# Patient Record
Sex: Male | Born: 1965 | Race: White | Hispanic: No | Marital: Married | Smoking: Never smoker
Health system: Southern US, Community
[De-identification: ages and names within clinical notes are randomized; demographics above are authoritative.]

## PROBLEM LIST (undated history)

## (undated) HISTORY — PX: MOHS SURGERY: SUR867

## (undated) HISTORY — PX: OTHER SURGICAL HISTORY: SHX169

---

## 1998-08-06 ENCOUNTER — Ambulatory Visit (HOSPITAL_COMMUNITY): Admission: RE | Admit: 1998-08-06 | Discharge: 1998-08-06 | Payer: Self-pay | Admitting: Psychiatry

## 1998-08-07 ENCOUNTER — Other Ambulatory Visit (HOSPITAL_COMMUNITY): Admission: RE | Admit: 1998-08-07 | Discharge: 1998-11-05 | Payer: Self-pay | Admitting: Psychiatry

## 2005-05-13 ENCOUNTER — Encounter: Admission: RE | Admit: 2005-05-13 | Discharge: 2005-05-13 | Payer: Self-pay | Admitting: Family Medicine

## 2006-08-26 ENCOUNTER — Ambulatory Visit: Payer: Self-pay | Admitting: Family Medicine

## 2007-01-14 ENCOUNTER — Ambulatory Visit: Payer: Self-pay | Admitting: Family Medicine

## 2008-03-22 ENCOUNTER — Ambulatory Visit: Payer: Self-pay | Admitting: Family Medicine

## 2009-06-27 ENCOUNTER — Ambulatory Visit: Payer: Self-pay | Admitting: Family Medicine

## 2009-11-07 ENCOUNTER — Ambulatory Visit: Payer: Self-pay | Admitting: Family Medicine

## 2009-12-03 ENCOUNTER — Ambulatory Visit: Payer: Self-pay | Admitting: Family Medicine

## 2009-12-13 ENCOUNTER — Ambulatory Visit: Payer: Self-pay | Admitting: Family Medicine

## 2010-01-10 ENCOUNTER — Ambulatory Visit: Payer: Self-pay | Admitting: Family Medicine

## 2011-05-20 ENCOUNTER — Ambulatory Visit (INDEPENDENT_AMBULATORY_CARE_PROVIDER_SITE_OTHER): Payer: BC Managed Care – PPO | Admitting: Family Medicine

## 2011-05-20 ENCOUNTER — Encounter: Payer: Self-pay | Admitting: Internal Medicine

## 2011-05-20 ENCOUNTER — Encounter: Payer: Self-pay | Admitting: Family Medicine

## 2011-05-20 VITALS — BP 124/80 | HR 78 | Temp 98.1°F | Wt 186.0 lb

## 2011-05-20 DIAGNOSIS — J209 Acute bronchitis, unspecified: Secondary | ICD-10-CM

## 2011-05-20 MED ORDER — ERYTHROMYCIN BASE 500 MG PO TABS: 500.0000 mg | ORAL_TABLET | Freq: Four times a day (QID) | ORAL | Status: AC

## 2011-05-20 NOTE — Patient Instructions (Signed)
Take all the antibiotics and call me if you not totally back to normal

## 2011-05-20 NOTE — Progress Notes (Signed)
  Subjective:    Patient ID: Ronald Shelton, male    DOB: 1965/08/08, 46 y.o.   MRN: [id number]  HPI  about 3 weeks he developed runny nose, nasal congestion, slight sore throat with coughing. The coughing did go away however it has returned and is now productive. He also notices myalgias and chills. He has no allergies and he does not smoke.   Review of Systems     Objective:   Physical Exam alert and in no distress. Tympanic membranes and canals are normal. Throat is clear. Tonsils are normal. Neck is supple without adenopathy or thyromegaly. Cardiac exam shows a regular sinus rhythm without murmurs or gallops. Lungs are clear to auscultation.        Assessment & Plan:  Bronchitis. Will treat with erythromycin. He is to call if not entirely better

## 2011-06-01 ENCOUNTER — Other Ambulatory Visit (INDEPENDENT_AMBULATORY_CARE_PROVIDER_SITE_OTHER): Payer: BC Managed Care – PPO

## 2011-06-01 DIAGNOSIS — Z23 Encounter for immunization: Secondary | ICD-10-CM

## 2011-09-02 ENCOUNTER — Encounter: Payer: Self-pay | Admitting: Medical

## 2011-09-02 ENCOUNTER — Ambulatory Visit (INDEPENDENT_AMBULATORY_CARE_PROVIDER_SITE_OTHER): Payer: BC Managed Care – PPO | Admitting: Medical

## 2011-09-02 VITALS — BP 98/60 | HR 80 | Temp 98.3°F | Resp 16 | Wt 188.0 lb

## 2011-09-02 DIAGNOSIS — H6120 Impacted cerumen, unspecified ear: Secondary | ICD-10-CM

## 2011-09-02 DIAGNOSIS — H6590 Unspecified nonsuppurative otitis media, unspecified ear: Secondary | ICD-10-CM

## 2011-09-02 NOTE — Progress Notes (Signed)
Subjective: Here for c/o left ear stopped up, swims a lot, gets ear wax buildup time to time.   Tried peroxide last night, and ear been bothering him the last 24 hours.   Feels stopped up.  He and family have all been passing around sore throat, cold symptoms in general.  Not sure if this is illness or wax.    ROS Gen: no fever, chills Heent: +scratchy throat, ear fullness L, no sinus pressure Lungs: no SOB, wheezing GI: negative  Objective: Gen: wd, wn, nad HEENT: normocephalic, sclerae anicteric,left ear canal with impacted cerumen, right canal patent, TMs with serous fluid, nares patent, no discharge or erythema, pharynx normal Oral cavity: MMM, no lesions Neck: supple, no lymphadenopathy, no thyromegaly, no masses Heart: RRR, normal S1, S2, no murmurs Lungs: CTA bilaterally, no wheezes, rhonchi, or rales  Assessment and Plan :    Encounter Diagnoses  Name Primary?  . Impacted cerumen Yes  . Serous otitis media    Used ear lavage to remove cerumen successfully, pt tolerate procedure well.  Advised decongestant OTC for ear fullness.  Cal/return if not improving.

## 2011-09-03 ENCOUNTER — Encounter: Payer: Self-pay | Admitting: Medical

## 2011-09-03 ENCOUNTER — Ambulatory Visit (INDEPENDENT_AMBULATORY_CARE_PROVIDER_SITE_OTHER): Payer: BC Managed Care – PPO | Admitting: Medical

## 2011-09-03 VITALS — BP 98/70 | Temp 98.1°F | Wt 188.0 lb

## 2011-09-03 DIAGNOSIS — B029 Zoster without complications: Secondary | ICD-10-CM

## 2011-09-03 MED ORDER — VALACYCLOVIR HCL 1 G PO TABS: 1000.0000 mg | ORAL_TABLET | Freq: Three times a day (TID) | ORAL | Status: DC

## 2011-09-03 NOTE — Patient Instructions (Signed)

## 2011-09-03 NOTE — Progress Notes (Signed)
Subjective:   HPI  Ronald Shelton is a 46 y.o. male who presents for possible illness. He and whole family have been sick recently with URI symptoms.  He notes that a few weeks ago, skin on right lower chest/upper abdomen felt tingly, but had no rash.  Didn't think much about it but the sensation has never went away, but starting yesterday started having red rash in the same area. He thought he has been rubbing it, but now worried about shingles.  This morning wife looked at it, and she thinks its shingles given her past hx/o shingles.   Wanted to check this out.  No other aggravating or relieving factors.  Denies any new exposures, no new hygiene products, except used roundup on weeds in the yard few weeks ago.  Washed hands good afterwards, had clothes covering the abdomen at the time. He did have bad case of chicken pox as a child.   No other c/o.  The following portions of the patient's history were reviewed and updated as appropriate: allergies, current medications, past family history, past medical history, past social history, past surgical history and problem list.  No past medical history on file.  Allergies  Allergen Reactions  . Penicillins     Review of Systems Constitutional: -fever, -chills, -sweats ENT:+some ear fullness, slight nasal drainage, but no -sore throat Cardiology:  -chest pain, -palpitations, -edema Respiratory: -cough, -shortness of breath, -wheezing Gastroenterology: -abdominal pain, -nausea, -vomiting, +some loose stools recently, but no frank diarrhea, -constipation  Musculoskeletal: -arthralgias, -myalgias, -joint swelling, -back pain Ophthalmology: -vision changes Urology: -dysuria, -difficulty urinating, -hematuria, -urinary frequency, -urgency Neurology: -headache, -weakness, -tingling, -numbness       Objective:   Physical Exam  General appearance: alert, no distress, WD/WN Skin: right upper abdomen in T8 dermatome area with patch of erythema, but  no vesicles, no crusting HEENT: normocephalic, sclerae anicteric, TMs pearly, nares patent, no discharge or erythema, pharynx normal Oral cavity: MMM, no lesions Neck: supple, no lymphadenopathy, no thyromegaly, no masses Heart: RRR, normal S1, S2, no murmurs Lungs: CTA bilaterally, no wheezes, rhonchi, or rales Abdomen: +bs, soft, non tender, non distended, no masses, no hepatomegaly, no splenomegaly Pulses: 2+ symmetric   Assessment and Plan :     Encounter Diagnosis  Name Primary?  . Shingles Yes   Discussed diagnosis, possible complications, treatment, prevention from spreading to others.   Script today for Valtrex TID x 1 wk, 1000mg .  Ibuprofen OTC.   Call/return if not improving. Answered his questions.

## 2012-01-21 ENCOUNTER — Encounter: Payer: BC Managed Care – PPO | Admitting: Medical

## 2012-07-25 ENCOUNTER — Encounter: Payer: Self-pay | Admitting: Family Medicine

## 2012-07-25 ENCOUNTER — Ambulatory Visit (INDEPENDENT_AMBULATORY_CARE_PROVIDER_SITE_OTHER): Payer: BC Managed Care – PPO | Admitting: Family Medicine

## 2012-07-25 VITALS — BP 120/76 | HR 64 | Wt 197.0 lb

## 2012-07-25 DIAGNOSIS — M542 Cervicalgia: Secondary | ICD-10-CM

## 2012-07-25 NOTE — Progress Notes (Signed)
  Subjective:    Patient ID: Ronald Shelton, male    DOB: 09/26/1965, 47 y.o.   MRN: [id number]  HPI He has a week long history of neck pain especially with flexion and extension. No weakness, numbness or tingling or history of injury to his neck area;oday he is feeling slightly better. He did feel a slight tingling sensation in the upper back area. He states that today he is feeling somewhat better.   Review of Systems     Objective:   Physical Exam Alert and in no distress. Limitation of flexion and extension is noted. Lateral motion is fine. Normal motor, sensory and DTRs.       Assessment & Plan:  Neck pain recommend heat and stretching. Discussed x-rays and since he is getting better, we will wait,however if he has difficulty, he will call me. He is comfortable with this approach.

## 2012-07-25 NOTE — Patient Instructions (Signed)
Heat for 20 minutes 3 times per day. Use Advil or Tylenol for general pain relief. If your symptoms continue or don't go away, call me for x-rays

## 2012-07-26 ENCOUNTER — Telehealth: Payer: Self-pay | Admitting: Family Medicine

## 2012-07-26 ENCOUNTER — Other Ambulatory Visit: Payer: Self-pay

## 2012-07-26 DIAGNOSIS — M542 Cervicalgia: Secondary | ICD-10-CM

## 2012-07-26 NOTE — Telephone Encounter (Signed)
Order in computer pt informed to go to [facility] give name and dob and they will do his c-spine x-ray

## 2012-07-26 NOTE — Telephone Encounter (Signed)
Order a C-spine x-ray

## 2012-08-26 ENCOUNTER — Ambulatory Visit (INDEPENDENT_AMBULATORY_CARE_PROVIDER_SITE_OTHER): Payer: BC Managed Care – PPO | Admitting: Family Medicine

## 2012-08-26 ENCOUNTER — Encounter: Payer: Self-pay | Admitting: Family Medicine

## 2012-08-26 VITALS — BP 126/84 | HR 66 | Temp 98.3°F | Wt 192.0 lb

## 2012-08-26 DIAGNOSIS — J069 Acute upper respiratory infection, unspecified: Secondary | ICD-10-CM

## 2012-08-26 NOTE — Progress Notes (Signed)
  Subjective:    Patient ID: Ronald Shelton, male    DOB: [birth year], 47 y.o.   MRN: [id number]  HPI He has a three-day history of nasal and chest congestion with watery eyes. Yesterday he has developed a slightly sore throat and dry cough and today the cough has become slightly productive. He has no fever, chills, earache, upper tooth discomfort. He has had exposure to illnesses; wife is being treated for bronchitis and son had Throat last week. He is taking OTC meds with some relief of his symptoms. He does not smoke but does have a history of seasonal allergy.   Review of Systems     Objective:   Physical Exam alert and in no distress. Tympanic membranes and canals are normal. Throat is clear. Tonsils are normal. Neck is supple without adenopathy or thyromegaly. Cardiac exam shows a regular sinus rhythm without murmurs or gallops. Lungs are clear to auscultation.        Assessment & Plan:  Acute URI  recommend supportive care. He is to call if his symptoms worsen

## 2013-05-30 ENCOUNTER — Encounter: Payer: Self-pay | Admitting: Family Medicine

## 2013-05-30 ENCOUNTER — Ambulatory Visit (INDEPENDENT_AMBULATORY_CARE_PROVIDER_SITE_OTHER): Payer: BC Managed Care – PPO | Admitting: Family Medicine

## 2013-05-30 VITALS — BP 122/84 | HR 69 | Ht 69.0 in | Wt 196.0 lb

## 2013-05-30 DIAGNOSIS — K5900 Constipation, unspecified: Secondary | ICD-10-CM

## 2013-05-30 DIAGNOSIS — Z0000 Encounter for general adult medical examination without abnormal findings: Secondary | ICD-10-CM

## 2013-05-30 LAB — LIPID PANEL
Cholesterol: 255 mg/dL — ABNORMAL HIGH (ref 0–200)
HDL: 42 mg/dL (ref >39)
LDL Cholesterol: 186 mg/dL — ABNORMAL HIGH (ref 0–99)
Total CHOL/HDL Ratio: 6.1 Ratio
Triglycerides: 137 mg/dL (ref <150)
VLDL: 27 mg/dL (ref 0–40)

## 2013-05-30 LAB — CBC WITH DIFFERENTIAL/PLATELET
Basophils Absolute: 0 10*3/uL (ref 0.0–0.1)
Basophils Relative: 0 % (ref 0–1)
Eosinophils Absolute: 0.1 10*3/uL (ref 0.0–0.7)
Eosinophils Relative: 1 % (ref 0–5)
HCT: 44.1 % (ref 39.0–52.0)
HEMOGLOBIN: 15.7 g/dL (ref 13.0–17.0)
LYMPHS ABS: 4.2 10*3/uL — AB (ref 0.7–4.0)
Lymphocytes Relative: 51 % — ABNORMAL HIGH (ref 12–46)
MCH: 30.9 pg (ref 26.0–34.0)
MCHC: 35.6 g/dL (ref 30.0–36.0)
MCV: 86.8 fL (ref 78.0–100.0)
Monocytes Absolute: 0.7 10*3/uL (ref 0.1–1.0)
Monocytes Relative: 8 % (ref 3–12)
NEUTROS ABS: 3.3 10*3/uL (ref 1.7–7.7)
NEUTROS PCT: 40 % — AB (ref 43–77)
Platelets: 254 10*3/uL (ref 150–400)
RBC: 5.08 MIL/uL (ref 4.22–5.81)
RDW: 13.6 % (ref 11.5–15.5)
WBC: 8.2 10*3/uL (ref 4.0–10.5)

## 2013-05-30 LAB — COMPREHENSIVE METABOLIC PANEL
ALK PHOS: 67 U/L (ref 39–117)
ALT: 25 U/L (ref 0–53)
AST: 20 U/L (ref 0–37)
Albumin: 4.5 g/dL (ref 3.5–5.2)
BUN: 15 mg/dL (ref 6–23)
CO2: 27 mEq/L (ref 19–32)
Calcium: 9.1 mg/dL (ref 8.4–10.5)
Chloride: 101 mEq/L (ref 96–112)
Creat: 0.9 mg/dL (ref 0.50–1.35)
GLUCOSE: 93 mg/dL (ref 70–99)
Potassium: 3.9 mEq/L (ref 3.5–5.3)
Sodium: 137 mEq/L (ref 135–145)
Total Bilirubin: 0.5 mg/dL (ref 0.3–1.2)
Total Protein: 7.4 g/dL (ref 6.0–8.3)

## 2013-05-30 LAB — HEMOCCULT GUIAC POC 1CARD (OFFICE)

## 2013-05-30 NOTE — Patient Instructions (Signed)
Fluids, bulk in your diet, exercise and listening to your body. We'll start with Metamucil regularly because of what your BMs to come out soft

## 2013-05-30 NOTE — Progress Notes (Signed)
   Subjective:    Patient ID: Ronald BernheimMark A Shelton, male    DOB: 05/23/1965, 48 y.o.   MRN: [id number]  HPI He is here for complete examination. His main concern today is bowel habits. He does seem to have intermittent difficulty with constipation and occasionally will also have bowel urgency especially after eating certain foods. He has been getting more bulk in his diet but this is so far not been beneficial. He seen no blood, had no vomiting, bloating or gas. Otherwise he has no other concerns or complaints. Family and social history were reviewed. He does have 2 children, and his wife are getting along well. He teaches at [employer].   Review of Systems Negative except as above    Objective:   Physical Exam BP 122/84  Pulse 69  Ht 5\' 9"  (1.753 m)  Wt 196 lb (88.905 kg)  BMI 28.93 kg/m2  SpO2 99%  General Appearance:    Alert, cooperative, no distress, appears stated age  Head:    Normocephalic, without obvious abnormalilty, atraumatic  Eyes:    PERRL, conjunctiva/corneas clear, EOM's intact, fundi    benign  Ears:    Normal TM's and external ear canals  Nose:   Nares normal, mucosa normal, no drainage or sinus   tenderness  Throat:   Lips, mucosa, and tongue normal; teeth and gums normal  Neck:   Supple, no lymphadenopathy;  thyroid:  no   enlargement/tenderness/nodules; no carotid   bruit or JVD  Back:    Spine nontender, no curvature, ROM normal, no CVA     tenderness  Lungs:     Clear to auscultation bilaterally without wheezes, rales or     ronchi; respirations unlabored  Chest Wall:    No tenderness or deformity   Heart:    Regular rate and rhythm, S1 and S2 normal, no murmur, rub   or gallop  Breast Exam:    No chest wall tenderness, masses or gynecomastia  Abdomen:     Soft, non-tender, nondistended, normoactive bowel sounds,    no masses, no hepatosplenomegaly  Genitalia:    Normal male external genitalia without lesions.  Testicles without masses.  No inguinal  hernias.  Rectal:    Normal sphincter tone, no masses or tenderness; guaiac negative stool.  Prostate smooth, no nodules, not enlarged.  Extremities:   No clubbing, cyanosis or edema  Pulses:   2+ and symmetric all extremities  Skin:   Skin color, texture, turgor normal, no rashes or lesions  Lymph nodes:   Cervical, supraclavicular, and axillary nodes normal  Neurologic:   CNII-XII intact, normal strength, sensation and gait; reflexes 2+ and symmetric throughout          Psych:   Normal mood, affect, hygiene and grooming.          Assessment & Plan:  Routine general medical examination at a health care facility - Plan: Comprehensive metabolic panel, Lipid panel, Hemoccult - 1 Card (office), CBC with Differential  Unspecified constipation Discussed treatment of the constipation with fluids, bulk in diet, exercise and listening to his body. We'll have him start with Metamucil and if no improvement then try MiraLax.

## 2013-05-31 ENCOUNTER — Encounter: Payer: Self-pay | Admitting: Family Medicine

## 2013-05-31 ENCOUNTER — Ambulatory Visit (INDEPENDENT_AMBULATORY_CARE_PROVIDER_SITE_OTHER): Payer: BC Managed Care – PPO | Admitting: Family Medicine

## 2013-05-31 VITALS — BP 112/70 | HR 84 | Temp 97.8°F | Ht 69.0 in | Wt 197.0 lb

## 2013-05-31 DIAGNOSIS — H109 Unspecified conjunctivitis: Secondary | ICD-10-CM

## 2013-05-31 NOTE — Patient Instructions (Signed)
  I recommend keeping eyes well lubricated with natural tears or other similar type of drops. It is okay to continue to use drops for redness as needed Return here (or to eye doctor) for re-evaluation if you develop pain, decreased vision, worsening symptoms. If you develop some itching and crusting, then you can call for drops, but it does NOT look like a viral conjunctivitis, and shouldn't be contagious.  I suspect that your eye got irritated (?shampoo/soap?).  Sometimes rosacea can affect the eyes, causing dry eyes, so follow up with eye doctor if symptoms persist/worsen despite the use of the OTC lubricating drops.

## 2013-05-31 NOTE — Progress Notes (Signed)
Chief Complaint  Patient presents with  . Eye Problem    last night before bed noticed that his right eye was red. This morning eye was even more red, not painful or itching. Says it is kind of stinging.  No drainage and not watery, did just want to have it looked at. Did use [employer] redness eye drops this am and has helped tremendously.    He was here yesterday for a physical, had no problems.  Noticed right eye redness after his shower last night.  Wasn't having any discomfort, didn't recall getting any shampoo in his eye.  Eye seemed a little redder in the morning.  He used some eye drops this morning, and it was significantly better.  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  He doesn't wear contacts.  No known foreign body exposure or sensation.  He notes that his eyes tend to be very "sensitive" in general.  Denies drainage or crusting, not itchy. Just minimally watery this morning.  History reviewed. No pertinent past medical history. Past Surgical History  Procedure Laterality Date  . Patellar tendonitis  95'    Dr. Thomasena Edisollins   History   Social History  . Marital Status: Married    Spouse Name: N/A    Number of Children: N/A  . Years of Education: N/A   Occupational History  . Not on file.   Social History Main Topics  . Smoking status: Never Smoker   . Smokeless tobacco: Never Used  . Alcohol Use: No  . Drug Use: No  . Sexual Activity: Yes   Other Topics Concern  . Not on file   Social History Narrative  . No narrative on file   Current outpatient prescriptions:FINACEA 15 % cream, Apply 1 application topically at bedtime. , Disp: , Rfl: ;  Tetrahydrozoline HCl (REDNESS RELIEVER EYE DROPS OP), Apply 2 drops to eye as needed., Disp: , Rfl:   Allergies  Allergen Reactions  . Penicillins    ROS:  Denies fevers, chills, URI symptoms, cough, shortness of breath, chest pain.  Denies vision loss, eye pain, allergy symptoms, or  other complaints.  PHYSICAL EXAM: BP 112/70  Pulse 84  Temp(Src) 97.8 F (36.6 C) (Oral)  Ht 5\' 9"  (1.753 m)  Wt 197 lb (89.359 kg)  BMI 29.08 kg/m2 Well developed, pleasant male in no distress HEENT:  Mod conjunctival injection on the right (laterally, medially, slightly inferiorly, none superiorly). There is no crusting or drainage.  No soft tissue swelling or erythema. PERRL, EOMI Normal fluori-strip exam (no abnormal uptake)   ASSESSMENT/PLAN:  Conjunctivitis, right eye  No evidence of viral or bacterial conjunctivitis.  Likely related to irritation or dryness (?related to possible exposure to shampoo, vs dryness from rosacea, although unclear why it would be unilateral).  I recommend keeping eyes well lubricated with natural tears or other similar type of drops. It is okay to continue to use drops for redness as needed Return here (or to eye doctor) for re-evaluation if you develop pain, decreased vision, worsening symptoms. If you develop some itching and crusting, then you can call for drops, but it does NOT look like a viral conjunctivitis, and shouldn't be contagious.  I suspect that your eye got irritated (?shampoo/soap?).  Sometimes rosacea can affect the eyes, causing dry eyes, so follow up with eye doctor if symptoms persist/worsen despite the use of the OTC lubricating drops.

## 2013-08-21 ENCOUNTER — Ambulatory Visit (INDEPENDENT_AMBULATORY_CARE_PROVIDER_SITE_OTHER): Payer: BC Managed Care – PPO | Admitting: Family Medicine

## 2013-08-21 ENCOUNTER — Encounter: Payer: Self-pay | Admitting: Family Medicine

## 2013-08-21 VITALS — Wt 194.0 lb

## 2013-08-21 DIAGNOSIS — IMO0002 Reserved for concepts with insufficient information to code with codable children: Secondary | ICD-10-CM

## 2013-08-21 DIAGNOSIS — S86111A Strain of other muscle(s) and tendon(s) of posterior muscle group at lower leg level, right leg, initial encounter: Secondary | ICD-10-CM

## 2013-08-21 DIAGNOSIS — S86819A Strain of other muscle(s) and tendon(s) at lower leg level, unspecified leg, initial encounter: Secondary | ICD-10-CM

## 2013-08-21 DIAGNOSIS — S838X9A Sprain of other specified parts of unspecified knee, initial encounter: Secondary | ICD-10-CM

## 2013-08-21 DIAGNOSIS — S56919A Strain of unspecified muscles, fascia and tendons at forearm level, unspecified arm, initial encounter: Secondary | ICD-10-CM

## 2013-08-21 NOTE — Patient Instructions (Signed)
Do as many things as you can palms down with her right arm to see if that will help. Go slow on both of these and watch your pain free you can slowly increase her physical activities. Start at 50% of what you were doing before and slowly increase

## 2013-08-21 NOTE — Progress Notes (Signed)
   Subjective:    Patient ID: Ronald Shelton, male    DOB: 07/30/1965, 48 y.o.   MRN: [id number]  HPI One week ago while exercising he noted a cramping sensation in the right medial calf area. He stopped exercising and do some stretching and ice. The cramping did go away but he did notice soreness in that area for the next several days. This morning when he got out of bed he again noted some discomfort in the medial aspect of his calf. He also states that while lifting a heavy object he felt some lateral elbow discomfort. His hand was in a supinated position. He does not note it when he pronates his hand.  Review of Systems     Objective:   Physical Exam Exam of the right arm shows good motion of the elbow and wrist. Palpation of the flexor muscles and tendons shows no defect however he does have some pain with flexion of those muscles. Exam of the right calf does show tenderness to palpation to the medial aspect of the gastrocnemius. Flexion of that muscle did show some slight muscle weakness in that area.     Assessment & Plan:  Strain of forearm  Strain of gastrocnemius tendon of right lower extremity  I explained that the forearm is probably a strain and conservative care including doing as many things pounds down as possible would be helpful. We also talked about the gastrocnemius and most likely has a small tear in the medial aspect of this. Again conservative care is appropriate. Instructed him to wait for pain relief and then slowly increase his physical activities based on this. If he has continued difficulty, referral for possible ultrasound will be made.

## 2013-09-07 ENCOUNTER — Ambulatory Visit: Payer: BC Managed Care – PPO | Admitting: Sports Medicine

## 2014-03-13 ENCOUNTER — Ambulatory Visit: Payer: BC Managed Care – PPO | Admitting: Family Medicine

## 2014-03-15 ENCOUNTER — Ambulatory Visit: Payer: BC Managed Care – PPO | Admitting: Family Medicine

## 2014-03-19 ENCOUNTER — Ambulatory Visit (INDEPENDENT_AMBULATORY_CARE_PROVIDER_SITE_OTHER): Payer: BC Managed Care – PPO | Admitting: Family Medicine

## 2014-03-19 ENCOUNTER — Encounter: Payer: Self-pay | Admitting: Family Medicine

## 2014-03-19 VITALS — BP 120/80 | HR 85 | Wt 191.0 lb

## 2014-03-19 DIAGNOSIS — M79672 Pain in left foot: Secondary | ICD-10-CM

## 2014-03-19 DIAGNOSIS — S76011A Strain of muscle, fascia and tendon of right hip, initial encounter: Secondary | ICD-10-CM

## 2014-03-19 DIAGNOSIS — M7711 Lateral epicondylitis, right elbow: Secondary | ICD-10-CM

## 2014-03-19 NOTE — Patient Instructions (Signed)
Do as many things as you can palms up and open. In terms of your right hip again if it hurts don't do it.

## 2014-03-19 NOTE — Progress Notes (Signed)
   Subjective:    Patient ID: Ronald BernheimMark A Shelton, male    DOB: 01/24/1966, 48 y.o.   MRN: [id number]  HPI He is here for consult. He has multiple orthopedic issues. He complains of a one-year history of right elbow and forearm discomfort. It bothers him especially when he pronates his hand. He will feel pain in the lateral elbow area. He has a previous history of surgery on that elbow as a teenager. Has been no popping or grinding. He also complains of right hip pain. 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 This is been bothering him for several weeks. When he flexes the hip or externally rotates it he has more discomfortThe season is about to end.he also complains of a two-week history of left lateral foot pain. He has used the same shoes. He does spend a lot of time on the field running with the athletes. The pain does get worse with increased physical activity. He has not had any change in his shoes.   Review of Systems     Objective:   Physical Exam Exam of the right elbow social motion. No tenderness to the elbow. He does have slight tenderness to the lateral epicondyles especially with gripping palms down. Right hip exam shows full motion. Pain is elicited with active motion but no pain with passive motion. No tenderness to palpation. Left foot exam does show tenderness over the midportion of the fifth metatarsal. Vibration did reproduce the pain.       Assessment & Plan:  Left foot pain - Plan: DG Foot Complete Left  Strain of hip flexor, right, initial encounter  Lateral epicondylitis (tennis elbow), right for the lateral epicondyle and hip, I have recommended conservative care. He is to do his main things palms up and open with his right hand to try and mitigate this. Relative rest of the hip especially since the season is about to end. Discussed possible ultrasound on both of these if continued difficulty. Clinically the right foot does indicate a probable  stress fracture. I will get an x-ray and have him use a wooden shoe.

## 2014-03-20 ENCOUNTER — Ambulatory Visit
Admission: RE | Admit: 2014-03-20 | Discharge: 2014-03-20 | Disposition: A | Discharge status: Home / Self Care | Payer: BC Managed Care – PPO | Source: Ambulatory Visit | Attending: Family Medicine | Admitting: Family Medicine

## 2014-03-20 DIAGNOSIS — M79672 Pain in left foot: Secondary | ICD-10-CM

## 2014-04-20 ENCOUNTER — Encounter: Payer: Self-pay | Admitting: Medical

## 2014-04-20 ENCOUNTER — Ambulatory Visit (INDEPENDENT_AMBULATORY_CARE_PROVIDER_SITE_OTHER): Payer: BC Managed Care – PPO | Admitting: Medical

## 2014-04-20 VITALS — BP 120/70 | HR 70 | Temp 98.1°F | Resp 16 | Wt 192.0 lb

## 2014-04-20 DIAGNOSIS — R05 Cough: Secondary | ICD-10-CM

## 2014-04-20 DIAGNOSIS — J0100 Acute maxillary sinusitis, unspecified: Secondary | ICD-10-CM

## 2014-04-20 DIAGNOSIS — R059 Cough, unspecified: Secondary | ICD-10-CM

## 2014-04-20 DIAGNOSIS — H65193 Other acute nonsuppurative otitis media, bilateral: Secondary | ICD-10-CM

## 2014-04-20 MED ORDER — HYDROCODONE-HOMATROPINE 5-1.5 MG/5ML PO SYRP: 5.0000 mL | ORAL_SOLUTION | Freq: Three times a day (TID) | ORAL | Status: DC | PRN

## 2014-04-20 MED ORDER — AZITHROMYCIN 250 MG PO TABS: ORAL_TABLET | ORAL | Status: DC

## 2014-04-20 NOTE — Progress Notes (Addendum)
Subjective:  Ronald BernheimMark A Shelton is a 48 y.o. male who presents for congestion and sore throat.  He reports over a week history of sinus pressure, headache, head congestion, sore throat, ear pressure, subjective fever. Denies nausea, vomiting, wheezing, shortness of breath. Using over-the-counter Sudafed. His wife and child has been sick this past week, wife was seen here a few days ago for similar, was put on antibiotic.usually in normal state of health, rarely gets sick. No other aggravating or relieving factors.  No other c/o.  ROS as in subjective.   Objective: Filed Vitals:   04/20/14 1525  BP: 120/70  Pulse: 70  Temp: 98.1 F (36.7 C)  Resp: 16    General appearance: Alert, WD/WN, no distress, mildly ill appearing                             Skin: warm, no rash                           Head: no sinus tenderness                            Eyes: conjunctiva normal, corneas clear, PERRLA                            Ears: right TM with erythema, left TM flat, external ear canals normal                          Nose: septum midline, turbinates swollen, with erythema and clear discharge             Mouth/throat: MMM, tongue normal, mild pharyngeal erythema                           Neck: supple, no adenopathy, no thyromegaly, nontender                         Lungs: CTA bilaterally, no wheezes, rales, or rhonchi     Assessment: Encounter Diagnoses  Name Primary?  . Acute maxillary sinusitis, recurrence not specified Yes  . Acute nonsuppurative otitis media of both ears   . Cough     Plan: Discussed diagnosis and treatment.  Begin Z-Pak antibiotic, he can use Hycodan cough syrup for worse cough as needed.  Suggested symptomatic OTC remedies.  Nasal saline spray for congestion.  Tylenol or Ibuprofen OTC for fever and malaise.  Call/return in 2-3 days if symptoms aren't resolving.   Patient was seen in conjunction with PA student Ronald Shelton, and I have also evaluated and examined  patient, agree with student's notes, student supervised by me.    This note was dictated using [employer] voice recognition software.  Any spelling, word, or phrase errors above were unintentional.

## 2014-05-01 ENCOUNTER — Telehealth: Payer: Self-pay | Admitting: Internal Medicine

## 2014-05-01 NOTE — Telephone Encounter (Signed)
Pt wanted to talk to you about an injury he discussed with you a few months back. 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

## 2014-05-03 NOTE — Telephone Encounter (Signed)
Set him up to see Zach Smith 

## 2014-05-04 NOTE — Telephone Encounter (Signed)
Called pt back to inform him of his appointment with Dr.Zack Katrinka BlazingSmith Jan.8 at 8:15 pt is aware

## 2014-05-04 NOTE — Telephone Encounter (Signed)
Left message for pt to call me back and to let me know why he needs to be seen because in the note there is multi.problems

## 2014-05-25 ENCOUNTER — Ambulatory Visit: Payer: BC Managed Care – PPO | Admitting: Family Medicine

## 2014-06-08 ENCOUNTER — Ambulatory Visit: Payer: BC Managed Care – PPO | Admitting: Family Medicine

## 2014-08-23 ENCOUNTER — Ambulatory Visit: Payer: BC Managed Care – PPO | Admitting: Sports Medicine

## 2016-04-07 IMAGING — CR DG FOOT COMPLETE 3+V*L*
3 series · 3 of 3 positions shown · non-contrast
Comparison: None.

CLINICAL DATA: Proximal lateral fifth metatarsal pain with initial
weight-bearing after rest ; progressive symptoms over 6 weeks

EXAM:
LEFT FOOT - COMPLETE 3+ VIEW

[view not recorded (1 of 3)]
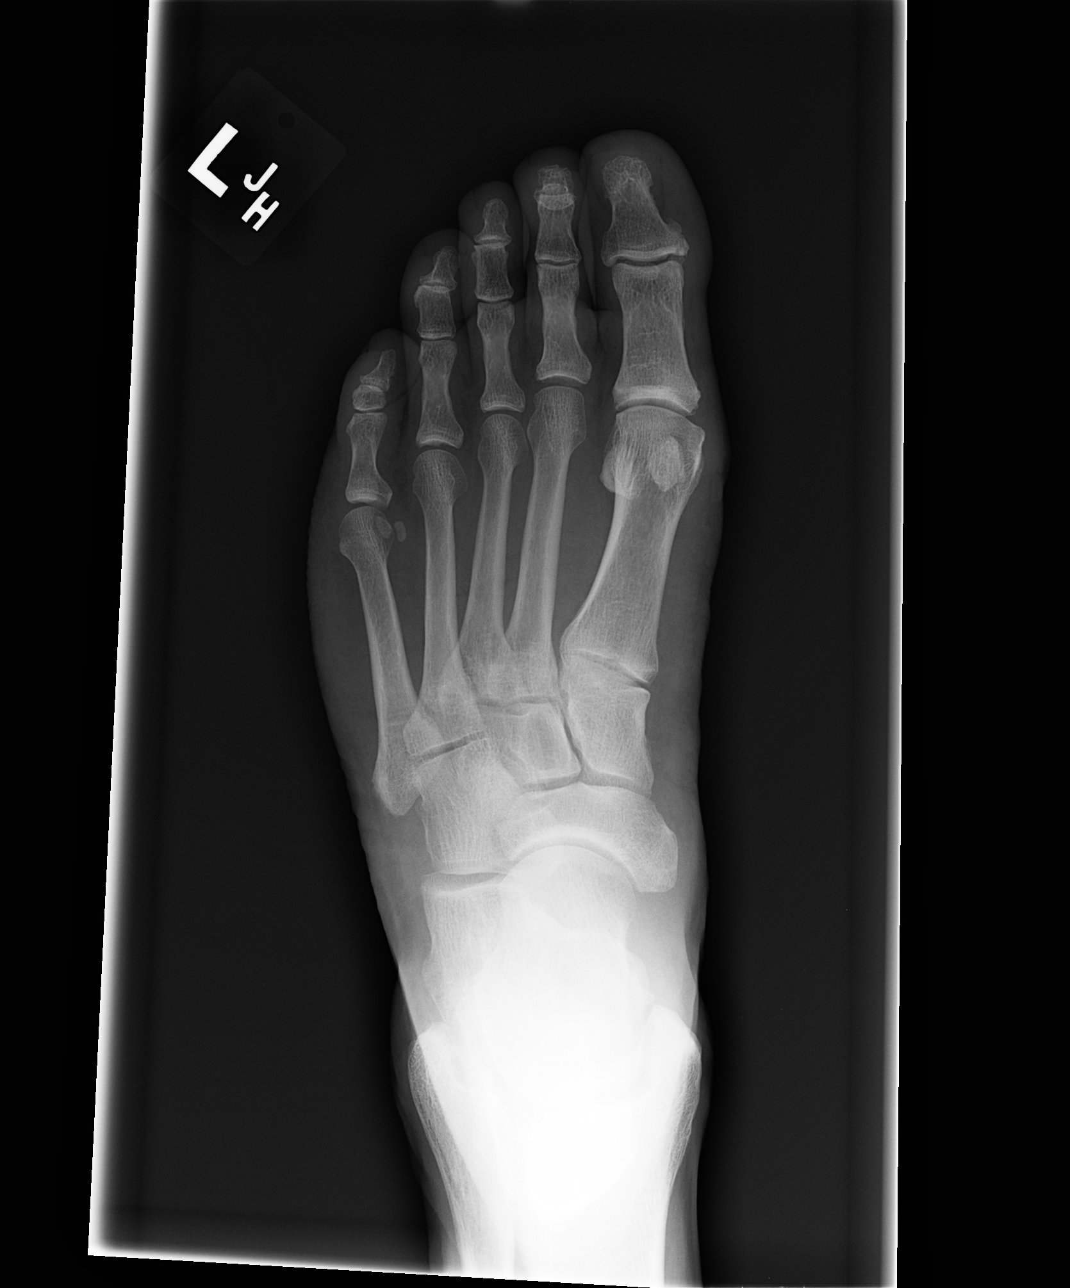

[view not recorded (2 of 3)]
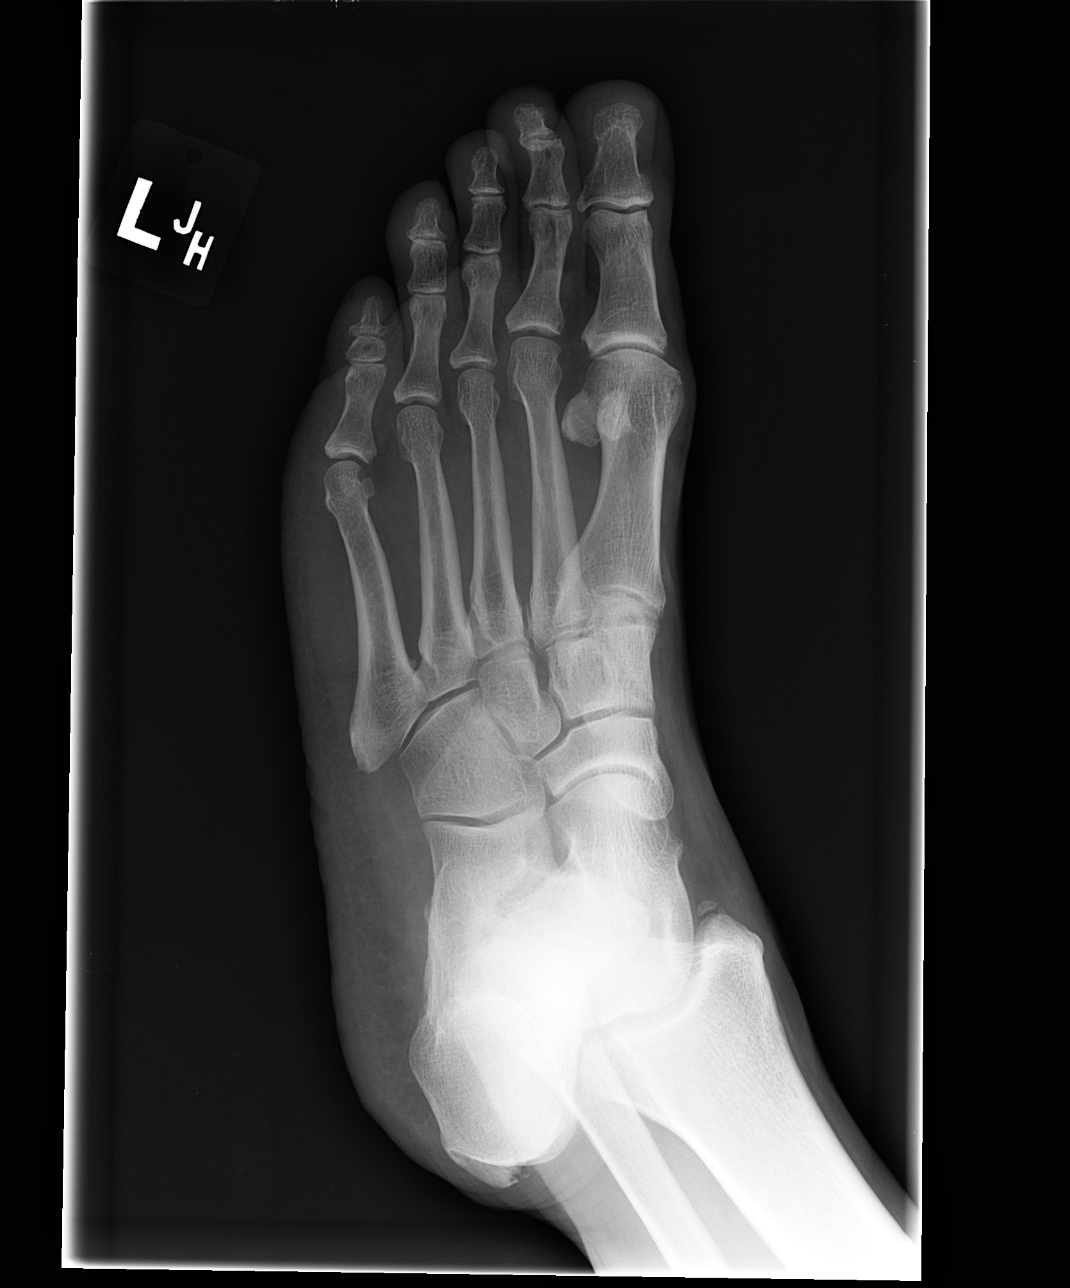

[view not recorded (3 of 3)]
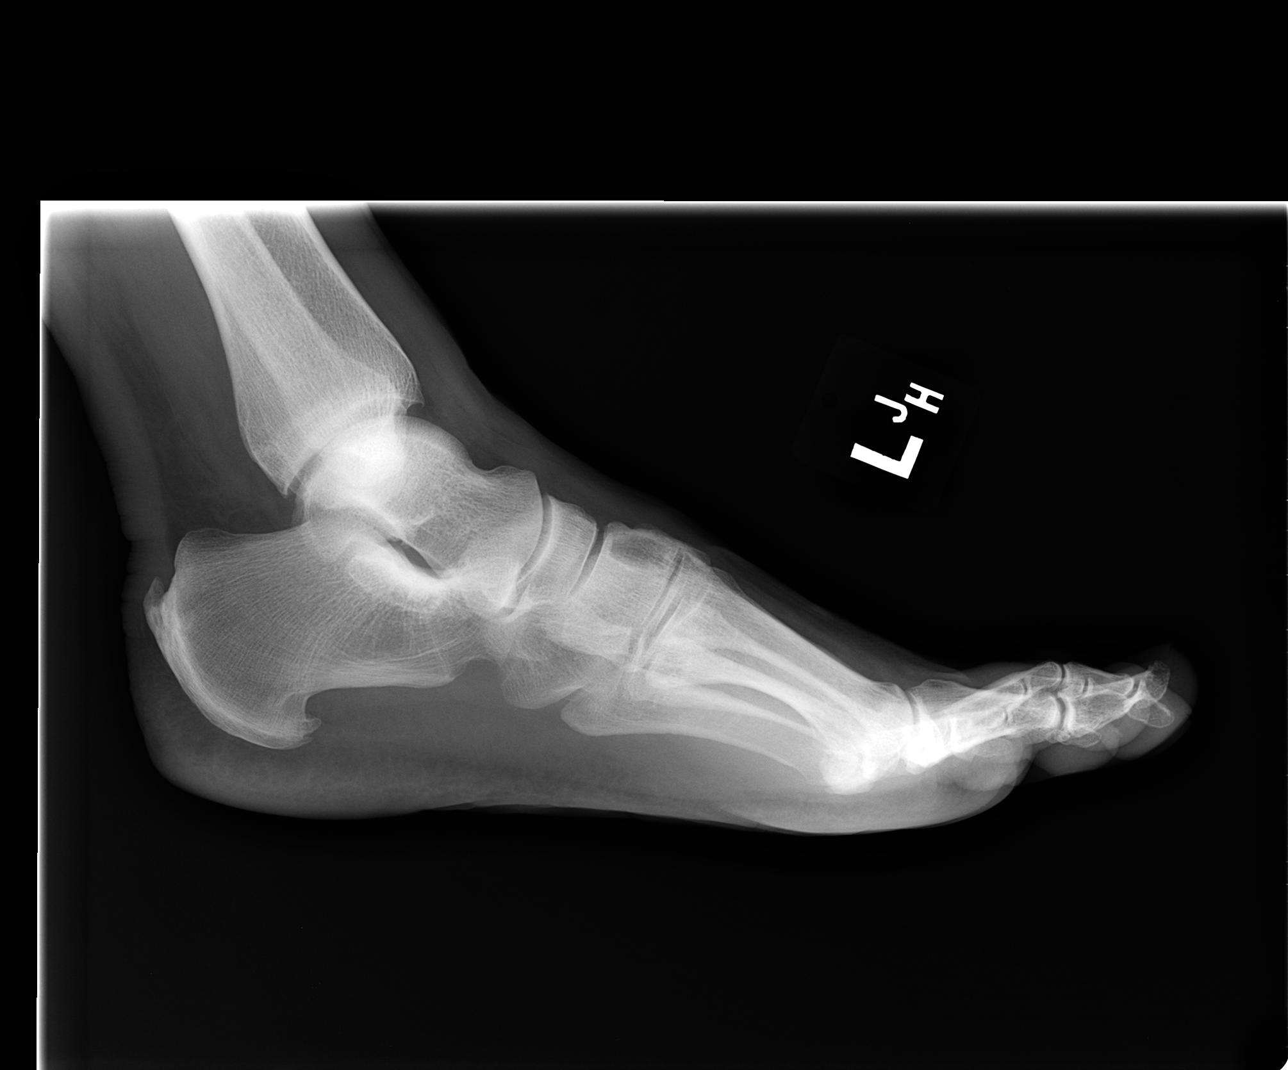

[3 of 3 positions shown; findings below may reference images not displayed]

FINDINGS: The bones of the foot are adequately mineralized. There is no
evidence of an acute or healing fracture. Specific attention to the
fifth metatarsal reveals no acute bony abnormality. The
interphalangeal, metatarsophalangeal, and tarsometatarsal joints are
normal in appearance. The bones of the hindfoot exhibit no acute
abnormalities. There are plantar and Achilles region calcaneal
spurs.
IMPRESSION: There is no acute or healing fracture of the fifth metatarsal nor
elsewhere within the bones of the foot. There are no findings to
suggest a stress reaction either.

## 2016-04-17 ENCOUNTER — Encounter: Payer: Self-pay | Admitting: Family Medicine

## 2016-04-17 ENCOUNTER — Ambulatory Visit (INDEPENDENT_AMBULATORY_CARE_PROVIDER_SITE_OTHER): Payer: BC Managed Care – PPO | Admitting: Family Medicine

## 2016-04-17 VITALS — BP 120/80 | HR 77 | Resp 16 | Ht 68.75 in | Wt 192.0 lb

## 2016-04-17 DIAGNOSIS — Z13220 Encounter for screening for lipoid disorders: Secondary | ICD-10-CM

## 2016-04-17 DIAGNOSIS — Z23 Encounter for immunization: Secondary | ICD-10-CM | POA: Diagnosis not present

## 2016-04-17 DIAGNOSIS — Z125 Encounter for screening for malignant neoplasm of prostate: Secondary | ICD-10-CM | POA: Diagnosis not present

## 2016-04-17 DIAGNOSIS — K5900 Constipation, unspecified: Secondary | ICD-10-CM | POA: Diagnosis not present

## 2016-04-17 DIAGNOSIS — Z0000 Encounter for general adult medical examination without abnormal findings: Secondary | ICD-10-CM | POA: Diagnosis not present

## 2016-04-17 DIAGNOSIS — Z1211 Encounter for screening for malignant neoplasm of colon: Secondary | ICD-10-CM | POA: Diagnosis not present

## 2016-04-17 LAB — CBC WITH DIFFERENTIAL/PLATELET
Basophils Absolute: 0 cells/uL (ref 0–200)
Basophils Relative: 0 %
Eosinophils Absolute: 85 cells/uL (ref 15–500)
Eosinophils Relative: 1 %
HEMATOCRIT: 45.6 % (ref 38.5–50.0)
Hemoglobin: 15.5 g/dL (ref 13.2–17.1)
LYMPHS ABS: 3145 {cells}/uL (ref 850–3900)
Lymphocytes Relative: 37 %
MCH: 30.5 pg (ref 27.0–33.0)
MCHC: 34 g/dL (ref 32.0–36.0)
MCV: 89.8 fL (ref 80.0–100.0)
MONO ABS: 680 {cells}/uL (ref 200–950)
MPV: 9.8 fL (ref 7.5–12.5)
Monocytes Relative: 8 %
NEUTROS ABS: 4590 {cells}/uL (ref 1500–7800)
Neutrophils Relative %: 54 %
Platelets: 263 10*3/uL (ref 140–400)
RBC: 5.08 MIL/uL (ref 4.20–5.80)
RDW: 13.5 % (ref 11.0–15.0)
WBC: 8.5 10*3/uL (ref 4.0–10.5)

## 2016-04-17 LAB — LIPID PANEL
Cholesterol: 230 mg/dL — ABNORMAL HIGH (ref <200)
HDL: 38 mg/dL — ABNORMAL LOW (ref >40)
LDL Cholesterol: 163 mg/dL — ABNORMAL HIGH (ref <100)
Total CHOL/HDL Ratio: 6.1 Ratio — ABNORMAL HIGH (ref <5.0)
Triglycerides: 147 mg/dL (ref <150)
VLDL: 29 mg/dL (ref <30)

## 2016-04-17 LAB — COMPREHENSIVE METABOLIC PANEL
ALBUMIN: 4.3 g/dL (ref 3.6–5.1)
ALT: 19 U/L (ref 9–46)
AST: 16 U/L (ref 10–35)
Alkaline Phosphatase: 57 U/L (ref 40–115)
BILIRUBIN TOTAL: 0.6 mg/dL (ref 0.2–1.2)
BUN: 16 mg/dL (ref 7–25)
CALCIUM: 8.8 mg/dL (ref 8.6–10.3)
CO2: 24 mmol/L (ref 20–31)
CREATININE: 0.97 mg/dL (ref 0.70–1.33)
Chloride: 104 mmol/L (ref 98–110)
Glucose, Bld: 93 mg/dL (ref 65–99)
Potassium: 4.1 mmol/L (ref 3.5–5.3)
Sodium: 136 mmol/L (ref 135–146)
TOTAL PROTEIN: 6.9 g/dL (ref 6.1–8.1)

## 2016-04-17 LAB — POCT URINALYSIS DIPSTICK
BILIRUBIN UA: NEGATIVE
Blood, UA: NEGATIVE
Glucose, UA: NEGATIVE
Ketones, UA: NEGATIVE
Leukocytes, UA: NEGATIVE
Nitrite, UA: NEGATIVE
Protein, UA: NEGATIVE
Urobilinogen, UA: NEGATIVE
pH, UA: 6

## 2016-04-17 NOTE — Progress Notes (Signed)
Subjective:    Patient ID: Ronald Shelton, male    DOB: 03/14/1966, 50 y.o.   MRN: [id number]  HPI Chief Complaint  Patient presents with  . Annual Exam    here with school form. Usually sees Dr.Lalonde for physicals, but needs [place] appt with Alenna Russell. concerns of inconsistent bowel movements. Dr. Susann GivensLalonde has told pt in the past its IBS. within last 6 months, reports this has gotten worse. c/o pain in Right heel- pain with physical activity.    He is here for a complete physical exam. Recently switched school systems and needs a physical form filled out. He normally sees Dr. Susann GivensLalonde but due to time issue he is seeing me.  He has a complaint today of what he feels is abnormal bowel habits. He reports going 3-4 days without having a bowel movement and then he has what he feels like his abnormally large stool. This has been ongoing for years and unchanged. He states at one point he did try Metamucil does not recall if it helped his symptoms. He has not tried anything in the past few months. He states his wife tells him that he has been dealing with this for years and it is just him. Denies fever, chills, abdominal pain, nausea, vomiting.   Last CPE: 2015  Other providers: dermatologist- Dr. Emily FilbertGould, eye doctor- [facility]. Orthopedists- [facility], [facility]. Dentist- Dr. Sharma CovertNorman. Eyes- Dr. Elmer PickerHecker  Past medical history: has had Moh's surgery for areas on scalp.   Family history: diabetes in paternal aunts. Uncle with pancreatitis.   Social history: Lives with wife, works as Runner, broadcasting/film/videoteacher Denies smoking, drinking alcohol, drug use Diet: eats "smart". Low fat  Exercise: 2 days per week he is physically active. Coaches soccer in the summer.   Immunizations: flu shot- wants this today. Tdap- never.   Health maintenance:  Colonoscopy: never Last PSA: never Last Dental Exam: up to date. Goes 3 times per week.   Last Eye Exam: July 2017. Reading glasses  Wears seatbelt always, uses sunscreen,  smoke detectors in home and functioning, does not text while driving, feels safe in home environment.  Reviewed allergies, medications, past medical, surgical, family, and social history.   Review of Systems  Review of Systems Constitutional: -fever, -chills, -sweats, -unexpected weight change,-fatigue ENT: -runny nose, -ear pain, -sore throat Cardiology:  -chest pain, -palpitations, -edema Respiratory: -cough, -shortness of breath, -wheezing Gastroenterology: -abdominal pain, -nausea, -vomiting, -diarrhea, +intermittent constipation  Hematology: -bleeding or bruising problems Musculoskeletal: -arthralgias, -myalgias, -joint swelling, -back pain Ophthalmology: -vision changes Urology: -dysuria, -difficulty urinating, -hematuria, -urinary frequency, -urgency Neurology: -headache, -weakness, -tingling, -numbness        Objective:   Physical Exam BP 120/80   Pulse 77   Resp 16   Ht 5' 8.75" (1.746 m)   Wt 192 lb (87.1 kg)   SpO2 97%   BMI 28.56 kg/m   General Appearance:    Alert, cooperative, no distress, appears stated age  Head:    Normocephalic, without obvious abnormality, atraumatic  Eyes:    PERRL, conjunctiva/corneas clear, EOM's intact, fundi    benign  Ears:    Normal TM's and external ear canals  Nose:   Nares normal, mucosa normal, no drainage or sinus   tenderness  Throat:   Lips, mucosa, and tongue normal; teeth and gums normal  Neck:   Supple, no lymphadenopathy;  thyroid:  no   enlargement/tenderness/nodules; no carotid   bruit or JVD  Back:    Spine  nontender, no curvature, ROM normal, no CVA     tenderness  Lungs:     Clear to auscultation bilaterally without wheezes, rales or     ronchi; respirations unlabored  Chest Wall:    No tenderness or deformity   Heart:    Regular rate and rhythm, S1 and S2 normal, no murmur, rub   or gallop  Breast Exam:    No chest wall tenderness, masses or gynecomastia  Abdomen:     Soft, non-tender, nondistended, normoactive  bowel sounds,    no masses, no hepatosplenomegaly  Genitalia:    Declined.   Rectal:    Not done. Referral to GI.  Extremities:   No clubbing, cyanosis or edema  Pulses:   2+ and symmetric all extremities  Skin:   Skin color, texture, turgor normal, no rashes or lesions  Lymph nodes:   Cervical, supraclavicular, and axillary nodes normal  Neurologic:   CNII-XII intact, normal strength, sensation and gait; reflexes 2+ and symmetric throughout          Psych:   Normal mood, affect, hygiene and grooming.    Urinalysis dipstick: negative      Assessment & Plan:  Annual physical exam - Plan: Urinalysis Dipstick, CBC with Differential/Platelet, Comprehensive metabolic panel, Lipid panel, PPD  Screening for prostate cancer - Plan: PSA  Screening for lipid disorders - Plan: Lipid panel  Special screening for malignant neoplasms, colon - Plan: Ambulatory referral to Gastroenterology  Constipation, unspecified constipation type - Plan: Ambulatory referral to Gastroenterology  Need for Tdap vaccination - Plan: Tdap vaccine greater than or equal to 7yo IM  Need for prophylactic vaccination and inoculation against influenza - Plan: Flu Vaccine QUAD 36+ mos IM  Discussed that overall he appears to be healthy and doing quite well. He is in a happy marriage and enjoying teaching. He is concerned with bowel movements however he also notes that this has been ongoing for several years. Discussed using a daily dose of MiraLAX and titrating it according to regular bowel movements if this is bothering him. Also advised him to discuss this with the gastroenterologist when he goes for his screening colonoscopy. Referral made to GI for colonoscopy. Discussed health maintenance. Willl update his immunizations today. Tdap given, flu shot given.  PPD placed and he will return Monday to have this read.  Labs ordered. He would like to have PSA checked. Discussed the controversial nature of this test. He  appears to be average risk for prostate cancer. Plan to follow up pending labs. He will follow-up with his PCP, Dr. Susann GivensLalonde, for future health concerns.  He will return on Monday the the form for work and have the PPD test read.

## 2016-04-17 NOTE — Patient Instructions (Addendum)
You can try daily Miralax if needed for more regular bowel movements as we discussed.  You received a flu shot, Tdap and TB test today.   The GI office will call you to set up an appointment.  We will call you with lab results.   Preventative Care for Adults, Male       REGULAR HEALTH EXAMS:  A routine yearly physical is a good way to check in with your primary care provider about your health and preventive screening. It is also an opportunity to share updates about your health and any concerns you have, and receive a thorough all-over exam.   Most health insurance companies pay for at least some preventative services.  Check with your health plan for specific coverages.  WHAT PREVENTATIVE SERVICES DO MEN NEED?  Adult men should have their weight and blood pressure checked regularly.   Men age 51 and older should have their cholesterol levels checked regularly.  Beginning at age 51 and continuing to age 51, men should be screened for colorectal cancer.  Certain people should may need continued testing until age 51.  Other cancer screening may include exams for testicular and prostate cancer.  Updating vaccinations is part of preventative care.  Vaccinations help protect against diseases such as the flu.  Lab tests are generally done as part of preventative care to screen for anemia and blood disorders, to screen for problems with the kidneys and liver, to screen for bladder problems, to check blood sugar, and to check your cholesterol level.  Preventative services generally include counseling about diet, exercise, avoiding tobacco, drugs, excessive alcohol consumption, and sexually transmitted infections.    GENERAL RECOMMENDATIONS FOR GOOD HEALTH:  Healthy diet:  Eat a variety of foods, including fruit, vegetables, animal or vegetable protein, such as meat, fish, chicken, and eggs, or beans, lentils, tofu, and grains, such as rice.  Drink plenty of water daily.  Decrease  saturated fat in the diet, avoid lots of red meat, processed foods, sweets, fast foods, and fried foods.  Exercise:  Aerobic exercise helps maintain good heart health. At least 30-40 minutes of moderate-intensity exercise is recommended. For example, a brisk walk that increases your heart rate and breathing. This should be done on most days of the week.   Find a type of exercise or a variety of exercises that you enjoy so that it becomes a part of your daily life.  Examples are running, walking, swimming, water aerobics, and biking.  For motivation and support, explore group exercise such as aerobic class, spin class, Zumba, Yoga,or  martial arts, etc.    Set exercise goals for yourself, such as a certain weight goal, walk or run in a race such as a 5k walk/run.  Speak to your primary care provider about exercise goals.  Disease prevention:  If you smoke or chew tobacco, find out from your caregiver how to quit. It can literally save your life, no matter how long you have been a tobacco user. If you do not use tobacco, never begin.   Maintain a healthy diet and normal weight. Increased weight leads to problems with blood pressure and diabetes.   The Body Mass Index or BMI is a way of measuring how much of your body is fat. Having a BMI above 27 increases the risk of heart disease, diabetes, hypertension, stroke and other problems related to obesity. Your caregiver can help determine your BMI and based on it develop an exercise and dietary program to help  you achieve or maintain this important measurement at a healthful level.  High blood pressure causes heart and blood vessel problems.  Persistent high blood pressure should be treated with medicine if weight loss and exercise do not work.   Fat and cholesterol leaves deposits in your arteries that can block them. This causes heart disease and vessel disease elsewhere in your body.  If your cholesterol is found to be high, or if you have heart  disease or certain other medical conditions, then you may need to have your cholesterol monitored frequently and be treated with medication.   Ask if you should have a stress test if your history suggests this. A stress test is a test done on a treadmill that looks for heart disease. This test can find disease prior to there being a problem.  Avoid drinking alcohol in excess (more than two drinks per day).  Avoid use of street drugs. Do not share needles with anyone. Ask for professional help if you need assistance or instructions on stopping the use of alcohol, cigarettes, and/or drugs.  Brush your teeth twice a day with fluoride toothpaste, and floss once a day. Good oral hygiene prevents tooth decay and gum disease. The problems can be painful, unattractive, and can cause other health problems. Visit your dentist for a routine oral and dental check up and preventive care every 6-12 months.   Look at your skin regularly.  Use a mirror to look at your back. Notify your caregivers of changes in moles, especially if there are changes in shapes, colors, a size larger than a pencil eraser, an irregular border, or development of new moles.  Safety:  Use seatbelts 100% of the time, whether driving or as a passenger.  Use safety devices such as hearing protection if you work in environments with loud noise or significant background noise.  Use safety glasses when doing any work that could send debris in to the eyes.  Use a helmet if you ride a bike or motorcycle.  Use appropriate safety gear for contact sports.  Talk to your caregiver about gun safety.  Use sunscreen with a SPF (or skin protection factor) of 15 or greater.  Lighter skinned people are at a greater risk of skin cancer. Don't forget to also wear sunglasses in order to protect your eyes from too much damaging sunlight. Damaging sunlight can accelerate cataract formation.   Practice safe sex. Use condoms. Condoms are used for birth control and  to help reduce the spread of sexually transmitted infections (or STIs).  Some of the STIs are gonorrhea (the clap), chlamydia, syphilis, trichomonas, herpes, HPV (human papilloma virus) and HIV (human immunodeficiency virus) which causes AIDS. The herpes, HIV and HPV are viral illnesses that have no cure. These can result in disability, cancer and death.   Keep carbon monoxide and smoke detectors in your home functioning at all times. Change the batteries every 6 months or use a model that plugs into the wall.   Vaccinations:  Stay up to date with your tetanus shots and other required immunizations. You should have a booster for tetanus every 10 years. Be sure to get your flu shot every year, since 5%-20% of the [place] population comes down with the flu. The flu vaccine changes each year, so being vaccinated once is not enough. Get your shot in the fall, before the flu season peaks.   Other vaccines to consider:  Pneumococcal vaccine to protect against certain types of pneumonia.  This is  normally recommended for adults age 51 or older.  However, adults younger than 51 years old with certain underlying conditions such as diabetes, heart or lung disease should also receive the vaccine.  Shingles vaccine to protect against Varicella Zoster if you are older than age 51, or younger than 51 years old with certain underlying illness.  Hepatitis A vaccine to protect against a form of infection of the liver by a virus acquired from food.  Hepatitis B vaccine to protect against a form of infection of the liver by a virus acquired from blood or body fluids, particularly if you work in health care.  If you plan to travel internationally, check with your local health department for specific vaccination recommendations.  Cancer Screening:  Most routine colon cancer screening begins at the age of 51. On a yearly basis, doctors may provide special easy to use take-home tests to check for hidden blood in the  stool. Sigmoidoscopy or colonoscopy can detect the earliest forms of colon cancer and is life saving. These tests use a small camera at the end of a tube to directly examine the colon. Speak to your caregiver about this at age 51, when routine screening begins (and is repeated every 5 years unless early forms of pre-cancerous polyps or small growths are found).   At the age of 51 men usually start screening for prostate cancer every year. Screening may begin at a younger age for those with higher risk. Those at higher risk include African-Americans or having a family history of prostate cancer. There are two types of tests for prostate cancer:   Prostate-specific antigen (PSA) testing. Recent studies raise questions about prostate cancer using PSA and you should discuss this with your caregiver.   Digital rectal exam (in which your doctor's lubricated and gloved finger feels for enlargement of the prostate through the anus).   Screening for testicular cancer.  Do a monthly exam of your testicles. Gently roll each testicle between your thumb and fingers, feeling for any abnormal lumps. The best time to do this is after a hot shower or bath when the tissues are looser. Notify your caregivers of any lumps, tenderness or changes in size or shape immediately.

## 2016-04-18 LAB — PSA: PSA: 0.9 ng/mL (ref <=4.0)

## 2016-04-20 LAB — TB SKIN TEST
Induration: 0 mm
TB Skin Test: NEGATIVE

## 2016-04-20 NOTE — Addendum Note (Signed)
Addended by: Debbrah AlarSMITH, Julliette Frentz F on: 04/20/2016 08:24 AM   Modules accepted: Orders

## 2016-04-22 ENCOUNTER — Encounter: Payer: Self-pay | Admitting: Internal Medicine

## 2016-06-19 ENCOUNTER — Encounter: Payer: Self-pay | Admitting: Family Medicine

## 2016-11-26 ENCOUNTER — Ambulatory Visit: Payer: Self-pay

## 2016-11-26 ENCOUNTER — Encounter: Payer: Self-pay | Admitting: Sports Medicine

## 2016-11-26 ENCOUNTER — Ambulatory Visit (INDEPENDENT_AMBULATORY_CARE_PROVIDER_SITE_OTHER): Payer: BC Managed Care – PPO | Admitting: Sports Medicine

## 2016-11-26 VITALS — BP 120/60 | Ht 69.0 in | Wt 185.0 lb

## 2016-11-26 DIAGNOSIS — M7660 Achilles tendinitis, unspecified leg: Secondary | ICD-10-CM

## 2016-11-26 DIAGNOSIS — M9261 Juvenile osteochondrosis of tarsus, right ankle: Secondary | ICD-10-CM | POA: Diagnosis not present

## 2016-11-26 DIAGNOSIS — M9262 Juvenile osteochondrosis of tarsus, left ankle: Secondary | ICD-10-CM | POA: Diagnosis not present

## 2016-11-26 MED ORDER — NITROGLYCERIN 0.2 MG/HR TD PT24: MEDICATED_PATCH | TRANSDERMAL | 1 refills | Status: DC

## 2016-11-26 NOTE — Patient Instructions (Addendum)
  Please try to find running and dress shoes with the softest heel back that you can find. This will relieve pressure on the bony part of your heel.  Please use insoles with lifts in your shoes for relief of achilles strain.   Do heel lifts off a thick book without weight at first and lift up on to your toes. Once you are able to do this without pain, add hand weights (up to 40 pounds, but start lower at first) and bend knees to do heel lifts. Try 3 sets of 10-15. Do not push yourself if this is too painful.   Use topical nitroglycerin to the achilles, this will help blood flow and healing:   Nitroglycerin Protocol   Apply 1/4 nitroglycerin patch to affected area daily.  Change position of patch within the affected area every 24 hours.  You may experience a headache during the first 1-2 weeks of using the patch, these should subside.  If you experience headaches after beginning nitroglycerin patch treatment, you may take your preferred over the counter pain reliever.  Another side effect of the nitroglycerin patch is skin irritation or rash related to patch adhesive.  Please notify our office if you develop more severe headaches or rash, and stop the patch.  Tendon healing with nitroglycerin patch may require 12 to 24 weeks depending on the extent of injury.  Men should not use if taking Viagra, Cialis, or Levitra.   Do not use if you have migraines or rosacea.

## 2016-11-26 NOTE — Progress Notes (Signed)
   Subjective:    Patient ID: Ronald BernheimMark A Shelton, male    DOB: 12/19/1965, 51 y.o.   MRN: [id number]  HPI CC: right achilles pain   Patient states this has been a chronic pain for years in his right achilles but has recently worsened. 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 The area swelled up and he could barely put weight on it for several days. Event organiserAthletic trainer at school set him up to see [facility], he saw a PA/NP there and was sent to PT. PT gave him some exercises to do and recommended iontophoresis steroid treatment, which he was unable to accomodate with his schedule. He has been frustrated and was recommended to come here by friends.  Unable to do soccer activities with the team. He is able to mountain bike still. Pain with lifting off motion or getting out of car.   Soc Hx: Water quality scientistsoccer coach for [employer] Meds- none PMH- none  Review of Systems- no numbness/tingling of lower extremities.      Objective:   Physical Exam  Well nourished, well appearing in NAD BP 120/60   Ht 5\' 9"  (1.753 m)   Wt 185 lb (83.9 kg)   BMI 27.32 kg/m    Right ankle: nodule visible, non-tender to palpation. No edema on physical exam. Full ROM but painful with plantarflexion and inversion of foot. Neurovascularly in tact. Mild pronation of feet bilaterally.  Arch is neutral to slightly pronated bilaterally  Ultrasound of bilateral heels   bilateral Haglund's deformities right > left.  Multiple calcifications Significant thickening of R achilles tendon with edema at insertion point. 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 Fields, MD       Assessment & Plan:   Bilateral Haglund's deformities- chronic,  -heel lifts inserted into shoes in office Sports insoles -recommend soft back work and sports shoes -exercise program given to patient to do for next 6 weeks -rx for nitroglycerin patches to apply topically to right achilles  -follow up 6 weeks

## 2017-01-21 ENCOUNTER — Ambulatory Visit: Payer: BC Managed Care – PPO | Admitting: Sports Medicine

## 2017-02-25 ENCOUNTER — Ambulatory Visit: Payer: BC Managed Care – PPO | Admitting: Sports Medicine

## 2017-03-09 ENCOUNTER — Ambulatory Visit: Payer: BC Managed Care – PPO | Admitting: Sports Medicine

## 2017-10-05 ENCOUNTER — Ambulatory Visit: Payer: BC Managed Care – PPO | Admitting: Family Medicine

## 2017-10-05 ENCOUNTER — Encounter: Payer: Self-pay | Admitting: Family Medicine

## 2017-10-05 VITALS — BP 128/80 | HR 71 | Temp 98.3°F | Wt 198.0 lb

## 2017-10-05 DIAGNOSIS — W57XXXA Bitten or stung by nonvenomous insect and other nonvenomous arthropods, initial encounter: Secondary | ICD-10-CM

## 2017-10-05 DIAGNOSIS — S80861A Insect bite (nonvenomous), right lower leg, initial encounter: Secondary | ICD-10-CM

## 2017-10-05 MED ORDER — TRIAMCINOLONE ACETONIDE 0.1 % EX CREA: 1.0000 "application " | TOPICAL_CREAM | Freq: Two times a day (BID) | CUTANEOUS | 0 refills | Status: DC

## 2017-10-05 NOTE — Progress Notes (Signed)
   Subjective:    Patient ID: Ronald Shelton, male    DOB: March 18, 1966, 52 y.o.   MRN: [id number]  HPI Chief Complaint  Patient presents with  . bug bite    bug bite, couple days ago. on the back on the legs   He is here with complaints of a 3 day history of a pruritic insect bite on his left calf and several insect bites to his right lower leg. States he has been putting [place] anti-itch cream on the areas. States his right lower leg seems to be getting worse and is red, warm and has a burning sensation.  Denies fever, chills, fatigue, N/V/D.   Review of Systems Pertinent positives and negatives in the history of present illness.     Objective:   Physical Exam BP 128/80   Pulse 71   Temp 98.3 F (36.8 C) (Oral)   Wt 198 lb (89.8 kg)   SpO2 98%   BMI 29.24 kg/m   Red patch of coalescing insect bites with mild increased warmth. No induration, drainage, surrounding erythema or sign of infection. RLE is neurovascularly intact.        Assessment & Plan:  Insect bite of right lower leg, initial encounter - Plan: triamcinolone cream (KENALOG) 0.1 %  He will take a picture of the area to keep track.  Try triamcinolone and take Benadryl if needed for itching. Follow up if area worsens.

## 2017-10-07 ENCOUNTER — Telehealth: Payer: Self-pay

## 2017-10-07 NOTE — Telephone Encounter (Signed)
Patient called states that his sore on his leg is not getting worse but it is not getting any better either.  It is still red, itchy and inflamed.  Patient would like to know what the next step is in getting this resolved. Please call him back at [phone].  He uses [facility] at [employer].

## 2017-10-07 NOTE — Telephone Encounter (Signed)
Pt states his rash was red a couple days ago and now burgundy looking. Pt was advised that a provider would need to look at him to determine if antibiotic is needed or not.  Pt is coming in tomorrow afternoon

## 2017-10-07 NOTE — Telephone Encounter (Signed)
I would continue using the steroid for now. I do not recommend oral steroids since this is a localized rash. He should come in tomorrow and let Vincenza Hews look at it and see if he needs an antibiotic.

## 2017-10-08 ENCOUNTER — Encounter: Payer: Self-pay | Admitting: Medical

## 2017-10-08 ENCOUNTER — Ambulatory Visit: Payer: BC Managed Care – PPO | Admitting: Medical

## 2017-10-08 VITALS — BP 110/76 | HR 71 | Temp 98.2°F | Ht 68.75 in | Wt 197.4 lb

## 2017-10-08 DIAGNOSIS — S80869D Insect bite (nonvenomous), unspecified lower leg, subsequent encounter: Secondary | ICD-10-CM | POA: Diagnosis not present

## 2017-10-08 DIAGNOSIS — W57XXXD Bitten or stung by nonvenomous insect and other nonvenomous arthropods, subsequent encounter: Secondary | ICD-10-CM

## 2017-10-08 DIAGNOSIS — R21 Rash and other nonspecific skin eruption: Secondary | ICD-10-CM

## 2017-10-08 MED ORDER — PREDNISONE 10 MG PO TABS: ORAL_TABLET | ORAL | 0 refills | Status: DC

## 2017-10-08 NOTE — Progress Notes (Signed)
Subjective:  Ronald Shelton is a 52 y.o. male who presents for recheck on rash. Chief Complaint  Patient presents with  . Rash    rash on right leg not getting better      Here for recheck on rash.  He was seen on 21 May by Beather Arbour, NP for same.  He notes last weekend he was out in his yard pulling brush and digging out weeds and ended up having some bug bites in the back of his left leg and a cluster of bug bites on his right lower lateral leg.  His wife was out helping and she had some bites as well.  He ended up getting a purplish red rash that has persisted and not improved at all.  When he was here the other day he was given steroid cream which has not seemed to help.  No fever, no body aches no chills.  Feels fine other than the rash and the rash is not itchy.  No other aggravating or relieving factors  The following portions of the patient's history were reviewed and updated as appropriate: allergies, current medications, past family history, past medical history, past social history, past surgical history and problem list.  ROS Otherwise as in subjective above    Objective: BP 110/76   Pulse 71   Temp 98.2 F (36.8 C) (Oral)   Ht 5' 8.75" (1.746 m)   Wt 197 lb 6.4 oz (89.5 kg)   SpO2 97%   BMI 29.36 kg/m   General appearance: alert, no distress, well developed, well nourished Skin: Posterior lower left leg below the knee with 2 separate 1 cm roughly diameter round pink- purplish lesion flat.  Right lower lateral leg distally with larger 4 centimeter by 5 cm irregular shaped somewhat butterfly shaped pink purplish lesion non blanching.  No surrounding erythema induration fluctuance or warmth.    Assessment: Encounter Diagnoses  Name Primary?  . Rash Yes  . Insect bite of lower leg, unspecified laterality, subsequent encounter      Plan: Begin round of prednisone oral, continue triamcinolone cream, can use oral Benadryl 1 tablet at night 1/2 tablet during the day  for the next several days and I expect the rash will gradually resolve.  Without any other obvious systemic symptoms reassured that this is likely self-limited.  Ronald Shelton was seen today for rash.  Diagnoses and all orders for this visit:  Rash  Insect bite of lower leg, unspecified laterality, subsequent encounter  Other orders -     predniSONE (DELTASONE) 10 MG tablet; 6/5/4/3/2/1    Follow up: prn

## 2017-12-31 ENCOUNTER — Encounter: Payer: Self-pay | Admitting: Family Medicine

## 2017-12-31 ENCOUNTER — Ambulatory Visit: Payer: BC Managed Care – PPO | Admitting: Family Medicine

## 2017-12-31 VITALS — BP 120/80 | HR 79 | Temp 98.9°F | Wt 197.8 lb

## 2017-12-31 DIAGNOSIS — L03011 Cellulitis of right finger: Secondary | ICD-10-CM

## 2017-12-31 MED ORDER — MUPIROCIN 2 % EX OINT: TOPICAL_OINTMENT | CUTANEOUS | 0 refills | Status: DC

## 2017-12-31 NOTE — Progress Notes (Signed)
   Subjective:    Patient ID: Ronald Shelton, male    DOB: 11/25/1965, 52 y.o.   MRN: [id number]  HPI Chief Complaint  Patient presents with  . finger swollen    middle fingernail swollen, about a week   Here with complaints of his 3rd digit on his right hand being red and swollen for the past 3-4 days. Recalls hitting this area on the buckle of his duffle bag last weekend. He then clipped the nail on that finger prior to the onset of redness.  He has not tried anything for his symptoms.  Denies warmth, tenderness. No drainage.   Denies fever, chills, N/V.    Review of Systems Pertinent positives and negatives in the history of present illness.     Objective:   Physical Exam BP 120/80   Pulse 79   Temp 98.9 F (37.2 C) (Oral)   Wt 197 lb 12.8 oz (89.7 kg)   BMI 29.42 kg/m   Right hand with mild edema and erythema to his lateral and proximal nail fold on the 3rd digit. No exudate, increased warmth, fluctuance or tenderness. Hand exam is normal otherwise. Non tender palmar surface.       Assessment & Plan:  Paronychia of finger of right hand - Plan: mupirocin ointment (BACTROBAN) 2 %  Tdap up to date I and D not indicated. He will do antiseptic soaks with warm water several times per day and use the Bactroban bid. Advised that if his symptoms worsen and if he develops any sign of infection on his palmar surface that he should be seen right away.

## 2017-12-31 NOTE — Patient Instructions (Signed)
Do the soaks several times per day for 10-15 minutes and use the Bactroban topical ointment.  If this gets worse then come back Monday.    Paronychia Paronychia is an infection of the skin. It happens near a fingernail or toenail. It may cause pain and swelling around the nail. Usually, it is not serious and it clears up with treatment. Follow these instructions at home:  Soak the fingers or toes in warm water as told by your doctor. You may be told to do this for 20 minutes, 2-3 times a day.  Keep the area dry when you are not soaking it.  Take medicines only as told by your doctor.  If you were given an antibiotic medicine, finish all of it even if you start to feel better.  Keep the affected area clean.  Do not try to drain a fluid-filled bump yourself.  Wear rubber gloves when putting your hands in water.  Wear gloves if your hands might touch cleaners or chemicals.  Follow your doctor's instructions about: ? Wound care. ? Bandage (dressing) changes and removal. Contact a doctor if:  Your symptoms get worse or do not improve.  You have a fever or chills.  You have redness spreading from the affected area.  You have more fluid, blood, or pus coming from the affected area.  Your finger or knuckle is swollen or is hard to move. This information is not intended to replace advice given to you by your health care provider. Make sure you discuss any questions you have with your health care provider. Document Released: 04/22/2009 Document Revised: 10/10/2015 Document Reviewed: 04/11/2014 Elsevier Interactive Patient Education  [employer]2018 [employer]

## 2018-05-20 ENCOUNTER — Other Ambulatory Visit (INDEPENDENT_AMBULATORY_CARE_PROVIDER_SITE_OTHER): Payer: BC Managed Care – PPO

## 2018-05-20 DIAGNOSIS — Z23 Encounter for immunization: Secondary | ICD-10-CM

## 2019-01-25 ENCOUNTER — Encounter: Payer: Self-pay | Admitting: Family Medicine

## 2019-01-25 ENCOUNTER — Other Ambulatory Visit: Payer: Self-pay

## 2019-01-25 ENCOUNTER — Ambulatory Visit: Payer: BC Managed Care – PPO | Admitting: Family Medicine

## 2019-01-25 VITALS — BP 132/80 | HR 71 | Temp 96.8°F | Wt 202.4 lb

## 2019-01-25 DIAGNOSIS — M940 Chondrocostal junction syndrome [Tietze]: Secondary | ICD-10-CM

## 2019-01-25 DIAGNOSIS — Z23 Encounter for immunization: Secondary | ICD-10-CM | POA: Diagnosis not present

## 2019-01-25 NOTE — Progress Notes (Signed)
   Subjective:    Patient ID: Donita Brooks, male    DOB: [birth year], 53 y.o.   MRN: [id number]  HPI  He complains of a 2-week history of left-sided chest pain that is pleuritic in nature.  He also notes that when he bends over he can have increased difficulty with that.  No associated shortness of breath, diaphoresis or weakness.  He can mountain bike without difficulty.  Review of Systems     Objective:   Physical Exam Alert and in no distress.  Tender to palpation over the left second costochondral junction.  Deep breathing also increase the discomfort.  Lungs are clear to auscultation.  Cardiac exam shows regular rhythm without murmurs or gallops.       Assessment & Plan:  Costochondritis  Need for influenza vaccination - Plan: Flu Vaccine QUAD 6+ mos PF IM (Fluarix Quad PF) I explained that he had costochondritis and what exactly it meant.  He was comfortable with that.  Recommend supportive care.

## 2019-07-06 ENCOUNTER — Ambulatory Visit: Payer: BC Managed Care – PPO

## 2019-07-10 ENCOUNTER — Ambulatory Visit: Payer: BC Managed Care – PPO | Attending: Family

## 2019-07-10 DIAGNOSIS — Z23 Encounter for immunization: Secondary | ICD-10-CM | POA: Insufficient documentation

## 2019-07-10 NOTE — Progress Notes (Signed)
   Covid-19 Vaccination Clinic  Name:  Ronald Shelton    MRN: [id number] DOB: 03-Jan-1966  07/10/2019  [name] was observed post Covid-19 immunization for 15 minutes without incidence. He was provided with Vaccine Information Sheet and instruction to access the V-Safe system.   [name] was instructed to call 911 with any severe reactions post vaccine: Marland Kitchen Difficulty breathing  . Swelling of your face and throat  . A fast heartbeat  . A bad rash all over your body  . Dizziness and weakness    Immunizations Administered    Name Date Dose VIS Date Route   Moderna COVID-19 Vaccine 07/10/2019  3:54 PM 0.5 mL 04/18/2019 Intramuscular   Manufacturer: Moderna   Lot: [id number]   NDC: 05397-673-41

## 2019-08-15 ENCOUNTER — Ambulatory Visit: Payer: BC Managed Care – PPO | Attending: Family

## 2019-08-15 DIAGNOSIS — Z23 Encounter for immunization: Secondary | ICD-10-CM

## 2019-08-15 NOTE — Progress Notes (Signed)
   Covid-19 Vaccination Clinic  Name:  Ronald Shelton    MRN: [id number] DOB: 05-03-1966  08/15/2019  Mr. Ronald Shelton was observed post Covid-19 immunization for 15 minutes without incident. He was provided with Vaccine Information Sheet and instruction to access the V-Safe system.   Mr. Ronald Shelton was instructed to call 911 with any severe reactions post vaccine: Marland Kitchen Difficulty breathing  . Swelling of face and throat  . A fast heartbeat  . A bad rash all over body  . Dizziness and weakness   Immunizations Administered    Name Date Dose VIS Date Route   Moderna COVID-19 Vaccine 08/15/2019 10:10 AM 0.5 mL 04/18/2019 Intramuscular   Manufacturer: Moderna   Lot: [id number]   NDC: 22025-427-06

## 2019-08-22 ENCOUNTER — Ambulatory Visit: Payer: BC Managed Care – PPO

## 2020-04-03 ENCOUNTER — Ambulatory Visit (INDEPENDENT_AMBULATORY_CARE_PROVIDER_SITE_OTHER): Payer: BC Managed Care – PPO

## 2020-04-03 ENCOUNTER — Other Ambulatory Visit: Payer: Self-pay

## 2020-04-03 DIAGNOSIS — Z23 Encounter for immunization: Secondary | ICD-10-CM

## 2020-04-10 ENCOUNTER — Ambulatory Visit: Payer: BC Managed Care – PPO | Admitting: Family Medicine

## 2020-04-10 ENCOUNTER — Encounter: Payer: Self-pay | Admitting: Family Medicine

## 2020-04-10 ENCOUNTER — Other Ambulatory Visit: Payer: Self-pay

## 2020-04-10 VITALS — BP 128/76 | HR 75 | Temp 96.8°F | Ht 68.75 in | Wt 189.2 lb

## 2020-04-10 DIAGNOSIS — Z0000 Encounter for general adult medical examination without abnormal findings: Secondary | ICD-10-CM

## 2020-04-10 DIAGNOSIS — Z1211 Encounter for screening for malignant neoplasm of colon: Secondary | ICD-10-CM

## 2020-04-10 DIAGNOSIS — Z125 Encounter for screening for malignant neoplasm of prostate: Secondary | ICD-10-CM

## 2020-04-10 DIAGNOSIS — K5900 Constipation, unspecified: Secondary | ICD-10-CM | POA: Diagnosis not present

## 2020-04-10 DIAGNOSIS — Z1159 Encounter for screening for other viral diseases: Secondary | ICD-10-CM

## 2020-04-10 DIAGNOSIS — Z85828 Personal history of other malignant neoplasm of skin: Secondary | ICD-10-CM

## 2020-04-10 NOTE — Patient Instructions (Signed)

## 2020-04-10 NOTE — Progress Notes (Signed)
   Subjective:    Patient ID: Ronald Shelton, male    DOB: Jul 14, 1965, 54 y.o.   MRN: [id number]  HPI He is here for complete examination.  He has enjoyed excellent health.  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  He sees dermatology regularly.  He and his wife have started an exercise program and also made some dietary changes.  He has had some difficulty with constipation over the last several years.  He cannot relate this to any particular foods, stress; no weight loss, nausea, vomiting.  No family history of colon cancer or colon polyps.  He does not smoke or drink.  Otherwise he has no particular concerns or complaints.  His marriage and home life are going well.  Family and social history as well as health maintenance and immunizations was reviewed.   Review of Systems  All other systems reviewed and are negative.      Objective:   Physical Exam Alert and in no distress. Tympanic membranes and canals are normal. Pharyngeal area is normal. Neck is supple without adenopathy or thyromegaly. Cardiac exam shows a regular sinus rhythm without murmurs or gallops. Lungs are clear to auscultation.  Abdominal exam shows no masses or tenderness with normal bowel sounds.       Assessment & Plan:  Routine general medical examination at a health care facility - Plan: CBC with Differential/Platelet, Comprehensive metabolic panel, Lipid panel  Constipation, unspecified constipation type  Need for hepatitis C screening test - Plan: Hepatitis C antibody  Screening for colon cancer - Plan: Cologuard  Screening for prostate cancer - Plan: PSA  History of skin cancer - BCE and squamous Information concerning constipation given.  Encouraged him to work on fluids, bulk in his diet as well as exercise.  He will call if continued difficulty. I also discussed screening for hepatitis C and prostate cancer.

## 2020-04-11 LAB — COMPREHENSIVE METABOLIC PANEL
ALT: 20 IU/L (ref 0–44)
AST: 21 IU/L (ref 0–40)
Albumin/Globulin Ratio: 1.6 (ref 1.2–2.2)
Albumin: 4.6 g/dL (ref 3.8–4.9)
Alkaline Phosphatase: 78 IU/L (ref 44–121)
BUN/Creatinine Ratio: 15 (ref 9–20)
BUN: 15 mg/dL (ref 6–24)
Bilirubin Total: 0.3 mg/dL (ref 0.0–1.2)
CO2: 26 mmol/L (ref 20–29)
Calcium: 9.3 mg/dL (ref 8.7–10.2)
Chloride: 100 mmol/L (ref 96–106)
Creatinine, Ser: 0.98 mg/dL (ref 0.76–1.27)
GFR calc Af Amer: 100 mL/min/{1.73_m2} (ref >59)
GFR calc non Af Amer: 86 mL/min/{1.73_m2} (ref >59)
Globulin, Total: 2.8 g/dL (ref 1.5–4.5)
Glucose: 94 mg/dL (ref 65–99)
Potassium: 4.5 mmol/L (ref 3.5–5.2)
Sodium: 138 mmol/L (ref 134–144)
Total Protein: 7.4 g/dL (ref 6.0–8.5)

## 2020-04-11 LAB — CBC WITH DIFFERENTIAL/PLATELET
Basophils Absolute: 0.1 10*3/uL (ref 0.0–0.2)
Basos: 1 %
EOS (ABSOLUTE): 0.2 10*3/uL (ref 0.0–0.4)
Eos: 2 %
Hematocrit: 47.2 % (ref 37.5–51.0)
Hemoglobin: 16.1 g/dL (ref 13.0–17.7)
Immature Grans (Abs): 0 10*3/uL (ref 0.0–0.1)
Immature Granulocytes: 0 %
Lymphocytes Absolute: 4 10*3/uL — ABNORMAL HIGH (ref 0.7–3.1)
Lymphs: 45 %
MCH: 29.9 pg (ref 26.6–33.0)
MCHC: 34.1 g/dL (ref 31.5–35.7)
MCV: 88 fL (ref 79–97)
Monocytes Absolute: 0.7 10*3/uL (ref 0.1–0.9)
Monocytes: 8 %
Neutrophils Absolute: 3.9 10*3/uL (ref 1.4–7.0)
Neutrophils: 44 %
Platelets: 286 10*3/uL (ref 150–450)
RBC: 5.39 x10E6/uL (ref 4.14–5.80)
RDW: 12.4 % (ref 11.6–15.4)
WBC: 8.9 10*3/uL (ref 3.4–10.8)

## 2020-04-11 LAB — LIPID PANEL
Chol/HDL Ratio: 5.5 ratio — ABNORMAL HIGH (ref 0.0–5.0)
Cholesterol, Total: 249 mg/dL — ABNORMAL HIGH (ref 100–199)
HDL: 45 mg/dL (ref >39)
LDL Chol Calc (NIH): 180 mg/dL — ABNORMAL HIGH (ref 0–99)
Triglycerides: 133 mg/dL (ref 0–149)
VLDL Cholesterol Cal: 24 mg/dL (ref 5–40)

## 2020-04-11 LAB — HEPATITIS C ANTIBODY: Hep C Virus Ab: 0.2 s/co ratio (ref 0.0–0.9)

## 2020-04-11 LAB — PSA: Prostate Specific Ag, Serum: 1.4 ng/mL (ref 0.0–4.0)

## 2020-06-17 ENCOUNTER — Telehealth: Payer: Self-pay

## 2020-06-17 NOTE — Telephone Encounter (Signed)
Pt was called to advise the need to turn his cologuard kit within 60 Days [place]

## 2020-08-01 ENCOUNTER — Encounter: Payer: Self-pay | Admitting: Family Medicine

## 2020-08-05 ENCOUNTER — Other Ambulatory Visit: Payer: Self-pay

## 2020-08-05 ENCOUNTER — Other Ambulatory Visit: Payer: BC Managed Care – PPO

## 2020-08-05 DIAGNOSIS — Z111 Encounter for screening for respiratory tuberculosis: Secondary | ICD-10-CM

## 2020-08-06 LAB — QUANTIFERON-TB GOLD PLUS

## 2020-08-07 LAB — QUANTIFERON-TB GOLD PLUS
QuantiFERON TB1 Ag Value: 0.44 IU/mL
QuantiFERON TB2 Ag Value: 0.49 IU/mL
QuantiFERON-TB Gold Plus: NEGATIVE

## 2020-08-09 ENCOUNTER — Telehealth: Payer: Self-pay

## 2020-08-09 NOTE — Telephone Encounter (Signed)
LVM for pt advising of his labs done and form was faxed back. [facility]

## 2020-09-03 ENCOUNTER — Encounter: Payer: Self-pay | Admitting: Family Medicine

## 2020-11-25 ENCOUNTER — Ambulatory Visit: Payer: BC Managed Care – PPO | Admitting: Family Medicine

## 2020-11-25 ENCOUNTER — Encounter: Payer: Self-pay | Admitting: Family Medicine

## 2020-11-25 ENCOUNTER — Other Ambulatory Visit: Payer: Self-pay

## 2020-11-25 VITALS — BP 130/80 | HR 68 | Ht 68.75 in | Wt 197.0 lb

## 2020-11-25 DIAGNOSIS — Z23 Encounter for immunization: Secondary | ICD-10-CM

## 2020-11-25 DIAGNOSIS — M62838 Other muscle spasm: Secondary | ICD-10-CM

## 2020-11-25 DIAGNOSIS — S29012A Strain of muscle and tendon of back wall of thorax, initial encounter: Secondary | ICD-10-CM | POA: Diagnosis not present

## 2020-11-25 MED ORDER — METHOCARBAMOL 500 MG PO TABS: 500.0000 mg | ORAL_TABLET | Freq: Three times a day (TID) | ORAL | 0 refills | Status: DC | PRN

## 2020-11-25 NOTE — Progress Notes (Signed)
Chief Complaint  Patient presents with   Back Pain    Upper right sided back pain since Friday. Friday could not even get his hands over his head to shower. Has gotten better since. But still bothersome. Not sure what he did.    Patient first noted discomfort in R upper back on Thursday, got progressively worse throughout the day, was much worse when he woke up on Friday.  He reports the pain is at his upper right back/shoulder, just to right of spine.  Hurts when looking up or when raising both arms behind his head. Pain was severe on Friday (when he called to make appointment.) It has gotten better over the weekend--tried heating pad, hot shower, and took Aleve (2 pills once daily).  Still has discomfort  2 mos ago--reports he had similar pain, radiated up to trap, and at that time noted pain/numbness into the R arm. Resolved after 2 days.   PMH, PSH, SH reviewed  Has issues at trapezius once a year where he has trouble turning his head. This is different, lower in the back  Outpatient Encounter Medications as of 11/25/2020  Medication Sig Note   Azelaic Acid 15 % cream Apply 1 application topically 2 (two) times daily.    naproxen sodium (ALEVE) 220 MG tablet Take 440 mg by mouth. 11/25/2020: Last dose Sun am   clindamycin (CLEOCIN T) 1 % external solution  (Patient not taking: Reported on 11/25/2020)    [DISCONTINUED] fluorometholone (FML) 0.1 % ophthalmic suspension Place into the left eye.    No facility-administered encounter medications on file as of 11/25/2020.   Allergies  Allergen Reactions   Penicillins     ROS:  No neck pain. No f/cn/v/d/abdominal pain No URI, chest pan, SOB No numbness, tingling, weakness in arms.   PHYSICAL EXAM:  BP 130/80   Pulse 68   Ht 5' 8.75" (1.746 m)   Wt 197 lb (89.4 kg)   BMI 29.30 kg/m   Well-appearing, pleasant male, who appears comfortable, in no distress HEENT: conjunctiva and sclera are clear, EOMI, wearing mask Neck: No  lymphadenopathy, thyromegaly or mass.  No spinal tenderness Back: no spinal tenderness or CVA tenderness He is tender at the upper rhomboid muscles on the right. Heart: regular rate and rhythm Lungs: clear bilaterally Extremities: no edema Neuro: alert and oriented. Normal strength, sensation, DTR's in UE's Psych: normal mood, affect, hygiene and grooming   ASSESSMENT/PLAN:  Strain of rhomboid muscle, initial encounter - strain vs spasm. Treat with heat, massage, stretch, NSAID, and muscle relaxant prn. consider PT if not resolving - Plan: methocarbamol (ROBAXIN) 500 MG tablet  Need for COVID-19 vaccine - Plan: Moderna Covid-19 Booster  Muscle spasm - Plan: methocarbamol (ROBAXIN) 500 MG tablet  Risks/SE of meds reviewed in detail, NSAID precautions reviewed. To increase Aleve to 2 BID, and use robaxin prn.   Do the stretches as shown (rotating your shoulder blades, hugging yourself and leaning forwards). Heat and massage to the muscle will also help. Continue Aleve--take 2 pill twice daily with food until pain is better. Use the muscle relaxant for more severe pain and spasm.

## 2020-11-25 NOTE — Patient Instructions (Signed)
  Do the stretches as shown (rotating your shoulder blades, hugging yourself and leaning forwards). Heat and massage to the muscle will also help. Continue Aleve--take 2 pill twice daily with food until pain is better. Use the muscle relaxant for more severe pain and spasm.

## 2020-12-03 ENCOUNTER — Encounter: Payer: Self-pay | Admitting: Family Medicine

## 2020-12-05 ENCOUNTER — Ambulatory Visit: Payer: BC Managed Care – PPO | Admitting: Family Medicine

## 2020-12-05 ENCOUNTER — Other Ambulatory Visit: Payer: Self-pay

## 2020-12-05 ENCOUNTER — Encounter: Payer: Self-pay | Admitting: Family Medicine

## 2020-12-05 VITALS — BP 120/70 | HR 72 | Ht 68.75 in | Wt 194.4 lb

## 2020-12-05 DIAGNOSIS — M5412 Radiculopathy, cervical region: Secondary | ICD-10-CM | POA: Diagnosis not present

## 2020-12-05 MED ORDER — METHYLPREDNISOLONE 4 MG PO TBPK
ORAL_TABLET | ORAL | 0 refills | Status: DC
Start: 2020-12-05 — End: 2022-07-23

## 2020-12-05 NOTE — Progress Notes (Signed)
Chief Complaint  Patient presents with   Follow-up    Back pain is better but now his right shoulder is bothering him. Deep thobbing pain in bicep and tricep. Fingers are tingling and numb. Having difficulty sleeping.    Patient was seen 7/11 with pain at rhomboid muscle on R. He had focal tenderness and spasm.  He has been taking aleve 440mg  BID and reports that as the shoulder/back pain improved, it migrated down to his R arm. Only 1 day was it more in the side of the neck, like he slept wrong. Then it started in the shoulder and under his arm, but in the last 2 days it has moved down to the elbow.  He describes a deep throbbing in the forearm near the elbow, and developed tingling in the first 3 fingers.  He denies weakness.  Had some trouble with putting the bandage on near his eye due to the numbness in the fingers (had something cut off by derm yesterday).  The worst aching that he gets is at night, in the underarm and upper chest area, and sometimes along the inner arm.  Hard to find a comfortable way to sleep. He describes his discomfort as an "achiness", deep throbbing. Discomfort is at the medial aspect of arm (between the tricep/bicep), and in his forearm he notes area of discomfort is at the lateral muscle--feels good to rub   PMH, PSH, SH reviewed  Outpatient Encounter Medications as of 12/05/2020  Medication Sig   Azelaic Acid 15 % cream Apply 1 application topically 2 (two) times daily.   clindamycin (CLEOCIN T) 1 % external solution    methocarbamol (ROBAXIN) 500 MG tablet Take 1-2 tablets (500-1,000 mg total) by mouth every 8 (eight) hours as needed for muscle spasms.   naproxen sodium (ALEVE) 220 MG tablet Take 440 mg by mouth.   No facility-administered encounter medications on file as of 12/05/2020.   Allergies  Allergen Reactions   Penicillins    ROS: Denies fever, chills, URI symptoms, headaches, dizziness, shortness of breath, chest pain.  Denies nausea, vomiting,  bowel changes, bleeding, bruising, rash. +RUE pain and tingling in fingers. See HPI   PHYSICAL EXAM:  BP 120/70   Pulse 72   Ht 5' 8.75" (1.746 m)   Wt 194 lb 6.4 oz (88.2 kg)   BMI 28.92 kg/m   Well-appearing male in no distress HEENT: conjunctiva and sclera are clear, bandage under R eye, wearing mask Neck: no lymphadenopathy or  mass. No c-spine tenderness Back: No muscle spasm in neck or upper back--no longer tender over rhomboids. Spine is nontender.  No CVA tenderness Abdomen: soft, nontender Extremities: no edema, 2+ pulses. Tingling located in the R thumb, index, and radial half of the middle finger. Normal sensation on ulnar side of 3rd finger and the 4th and 5th digits. Some discomfort occurred radiating up toward the elbow with Tinel tap. Negative Phalen. Neuro: subjective change in sensation as above.  Normal strength in UE's and fingers. DTR's 2+ and symmetric Psych: normal mood, affect, hygiene and grooming   ASSESSMENT/PLAN:  Cervical radiculopathy - steroid taper, risks SE reviewed. PT. If persistent discomfort, we reviewed SE and taper for gabapentin (will call next week if needed) - Plan: methylPREDNISolone (MEDROL DOSEPAK) 4 MG TBPK tablet  Medrol dosepak--Risks/SE reviewed  Discussed gabapentin side effects and taper; to call next week if needed, if no benefit from steroids.  PT--pt to call and let 02-14-1990 know if he needs referral from [place]  if can't get in with [facility] or [facility]   I spent 35 minutes dedicated to the care of this patient, including pre-visit review of records, face to face time, post-visit ordering of testing and documentation.

## 2020-12-05 NOTE — Patient Instructions (Signed)
Stop taking Aleve. Instead, take the steroid course as directed. You may use Tylenol while on the steroids. You can resume aleve or other anti-inflammatories once you finish the steroids, if needed.  Physical therapy is recommended--Try calling [facility] (Aart Schulenklopper) or [facility] and see if either of them have reasonable availability for you. If they can't see you soon enough, let us know and we can try either [facility], [facility] or [facility] physical therapy.  We may need to start a medication called Gabapentin which treats nerve pain, if you don't get a good response to the steroids, while awaiting for benefit from PT.

## 2020-12-05 NOTE — Telephone Encounter (Signed)
Pt coming in this morning

## 2021-04-15 ENCOUNTER — Encounter: Payer: Self-pay | Admitting: Family Medicine

## 2021-04-15 ENCOUNTER — Other Ambulatory Visit: Payer: Self-pay

## 2021-04-15 ENCOUNTER — Ambulatory Visit: Payer: 59 | Admitting: Family Medicine

## 2021-04-15 VITALS — BP 126/82 | HR 70 | Temp 97.1°F | Ht 68.5 in | Wt 191.6 lb

## 2021-04-15 DIAGNOSIS — Z1211 Encounter for screening for malignant neoplasm of colon: Secondary | ICD-10-CM

## 2021-04-15 DIAGNOSIS — Z23 Encounter for immunization: Secondary | ICD-10-CM | POA: Diagnosis not present

## 2021-04-15 DIAGNOSIS — M5412 Radiculopathy, cervical region: Secondary | ICD-10-CM

## 2021-04-15 DIAGNOSIS — Z0000 Encounter for general adult medical examination without abnormal findings: Secondary | ICD-10-CM | POA: Diagnosis not present

## 2021-04-15 NOTE — Progress Notes (Signed)
   Subjective:    Patient ID: Ronald Shelton, male    DOB: [birth year], 55 y.o.   MRN: [id number]  HPI He is here for complete examination.  He has had some discomfort in his shoulder and arm on the right-hand side and did respond well to physical therapy.  The symptoms are suggestive of a cervical root 6 issue.  At this point he is doing much better with that.  He otherwise has no particular concerns or complaints.  His work and home life are going well.  Family and social history as well as health maintenance and immunizations was reviewed.   Review of Systems  All other systems reviewed and are negative.     Objective:   Physical Exam Alert and in no distress. Tympanic membranes and canals are normal. Pharyngeal area is normal. Neck is supple without adenopathy or thyromegaly. Cardiac exam shows a regular sinus rhythm without murmurs or gallops. Lungs are clear to auscultation.  Abdominal exam shows no masses or tenderness with normal bowel sounds.        Assessment & Plan:  Routine general medical examination at a health care facility - Plan: CBC with Differential/Platelet, Comprehensive metabolic panel, Lipid panel  Need for influenza vaccination - Plan: Flu Vaccine QUAD 31mo+IM (Fluarix, Fluzone & Alfiuria Quad PF)  Need for COVID-19 vaccine - Plan: Moderna Covid-19 Vaccine Bivalent Booster  Cervical radiculopathy  Screening for colon cancer - Plan: Cologuard Discussed treatment of the possible cervical disc issue.  As long as he is doing well, no further intervention needed.  Discussed follow-up x-rays, possible MRI and epidurals if he gets to that point.  He will check into when he can get the shingles vaccine.  Otherwise encouraged him to continue to take good care of himself.

## 2021-04-16 LAB — CBC WITH DIFFERENTIAL/PLATELET
Basophils Absolute: 0 10*3/uL (ref 0.0–0.2)
Basos: 0 %
EOS (ABSOLUTE): 0.1 10*3/uL (ref 0.0–0.4)
Eos: 2 %
Hematocrit: 44.9 % (ref 37.5–51.0)
Hemoglobin: 15.8 g/dL (ref 13.0–17.7)
Immature Grans (Abs): 0 10*3/uL (ref 0.0–0.1)
Immature Granulocytes: 0 %
Lymphocytes Absolute: 3.1 10*3/uL (ref 0.7–3.1)
Lymphs: 47 %
MCH: 30.5 pg (ref 26.6–33.0)
MCHC: 35.2 g/dL (ref 31.5–35.7)
MCV: 87 fL (ref 79–97)
Monocytes Absolute: 0.6 10*3/uL (ref 0.1–0.9)
Monocytes: 8 %
Neutrophils Absolute: 2.9 10*3/uL (ref 1.4–7.0)
Neutrophils: 43 %
Platelets: 259 10*3/uL (ref 150–450)
RBC: 5.18 x10E6/uL (ref 4.14–5.80)
RDW: 12 % (ref 11.6–15.4)
WBC: 6.7 10*3/uL (ref 3.4–10.8)

## 2021-04-16 LAB — COMPREHENSIVE METABOLIC PANEL
ALT: 29 IU/L (ref 0–44)
AST: 22 IU/L (ref 0–40)
Albumin/Globulin Ratio: 1.8 (ref 1.2–2.2)
Albumin: 4.6 g/dL (ref 3.8–4.9)
Alkaline Phosphatase: 75 IU/L (ref 44–121)
BUN/Creatinine Ratio: 14 (ref 9–20)
BUN: 15 mg/dL (ref 6–24)
Bilirubin Total: 0.4 mg/dL (ref 0.0–1.2)
CO2: 25 mmol/L (ref 20–29)
Calcium: 9.1 mg/dL (ref 8.7–10.2)
Chloride: 101 mmol/L (ref 96–106)
Creatinine, Ser: 1.05 mg/dL (ref 0.76–1.27)
Globulin, Total: 2.6 g/dL (ref 1.5–4.5)
Glucose: 89 mg/dL (ref 70–99)
Potassium: 4.4 mmol/L (ref 3.5–5.2)
Sodium: 140 mmol/L (ref 134–144)
Total Protein: 7.2 g/dL (ref 6.0–8.5)
eGFR: 83 mL/min/{1.73_m2} (ref >59)

## 2021-04-16 LAB — LIPID PANEL
Chol/HDL Ratio: 5.4 ratio — ABNORMAL HIGH (ref 0.0–5.0)
Cholesterol, Total: 250 mg/dL — ABNORMAL HIGH (ref 100–199)
HDL: 46 mg/dL (ref >39)
LDL Chol Calc (NIH): 184 mg/dL — ABNORMAL HIGH (ref 0–99)
Triglycerides: 110 mg/dL (ref 0–149)
VLDL Cholesterol Cal: 20 mg/dL (ref 5–40)

## 2021-11-21 ENCOUNTER — Ambulatory Visit: Payer: 59 | Admitting: Family Medicine

## 2021-11-21 ENCOUNTER — Encounter: Payer: Self-pay | Admitting: Family Medicine

## 2021-11-21 VITALS — BP 110/62 | HR 71 | Temp 97.9°F | Wt 196.6 lb

## 2021-11-21 DIAGNOSIS — M79631 Pain in right forearm: Secondary | ICD-10-CM | POA: Diagnosis not present

## 2021-11-21 NOTE — Progress Notes (Signed)
   Subjective:    Patient ID: Ronald Shelton, male    DOB: 1965-06-24, 56 y.o.   MRN: [id number]  HPI He notes a several month history of right medial forearm discomfort.  He has been doing some relatively heavy lifting prior to that but does not remember any particular injury.  He also describes while typing he will get a tingling sensation in his index and thumb finger but it goes away very quickly.  Does have a previous history of neck pain with physical therapy but is not having any neck pain at the present time.   Review of Systems     Objective:   Physical Exam Exam of the right arm shows full motion of the elbow with no palpable tenderness over the medial or lateral epicondyle.  No palpable tenderness to the forearm but he does point to the medial aspect of the forearm.       Assessment & Plan:  Pain of right forearm I explained that the forearm pain is probably more muscular than anything and recommend relative rest.  Also recommend keeping his hand in a more neutral position rather than flexing it.  Talked about the fact that the thumb and index finger symptoms probably more median nerve related and to keep his wrist in a neutral position.  He expressed understanding of all of this.

## 2022-01-21 ENCOUNTER — Encounter: Payer: Self-pay | Admitting: Internal Medicine

## 2022-02-16 ENCOUNTER — Encounter: Payer: Self-pay | Admitting: Family Medicine

## 2022-02-16 ENCOUNTER — Ambulatory Visit: Payer: 59 | Admitting: Family Medicine

## 2022-02-16 VITALS — BP 122/64 | HR 73 | Temp 97.3°F | Wt 200.0 lb

## 2022-02-16 DIAGNOSIS — Z23 Encounter for immunization: Secondary | ICD-10-CM | POA: Diagnosis not present

## 2022-02-16 DIAGNOSIS — S39012A Strain of muscle, fascia and tendon of lower back, initial encounter: Secondary | ICD-10-CM

## 2022-02-16 DIAGNOSIS — G5601 Carpal tunnel syndrome, right upper limb: Secondary | ICD-10-CM | POA: Diagnosis not present

## 2022-02-16 MED ORDER — CARISOPRODOL 350 MG PO TABS: 350.0000 mg | ORAL_TABLET | Freq: Every day | ORAL | 0 refills | Status: DC

## 2022-02-16 NOTE — Patient Instructions (Signed)
Low Back Sprain or Strain Rehab Ask your health care provider which exercises are safe for you. Do exercises exactly as told by your health care provider and adjust them as directed. It is normal to feel mild stretching, pulling, tightness, or discomfort as you do these exercises. Stop right away if you feel sudden pain or your pain gets worse. Do not begin these exercises until told by your health care provider. Stretching and range-of-motion exercises These exercises warm up your muscles and joints and improve the movement and flexibility of your back. These exercises also help to relieve pain, numbness, and tingling. Lumbar rotation  Lie on your back on a firm bed or the floor with your knees bent. Straighten your arms out to your sides so each arm forms a 90-degree angle (right angle) with a side of your body. Slowly move (rotate) both of your knees to one side of your body until you feel a stretch in your lower back (lumbar). Try not to let your shoulders lift off the floor. Hold this position for __________ seconds. Tense your abdominal muscles and slowly move your knees back to the starting position. Repeat this exercise on the other side of your body. Repeat __________ times. Complete this exercise __________ times a day. Single knee to chest  Lie on your back on a firm bed or the floor with both legs straight. Bend one of your knees. Use your hands to move your knee up toward your chest until you feel a gentle stretch in your lower back and buttock. Hold your leg in this position by holding on to the front of your knee. Keep your other leg as straight as possible. Hold this position for __________ seconds. Slowly return to the starting position. Repeat with your other leg. Repeat __________ times. Complete this exercise __________ times a day. Prone extension on elbows  Lie on your abdomen on a firm bed or the floor (prone position). Prop yourself up on your elbows. Use your arms  to help lift your chest up until you feel a gentle stretch in your abdomen and your lower back. This will place some of your body weight on your elbows. If this is uncomfortable, try stacking pillows under your chest. Your hips should stay down, against the surface that you are lying on. Keep your hip and back muscles relaxed. Hold this position for __________ seconds. Slowly relax your upper body and return to the starting position. Repeat __________ times. Complete this exercise __________ times a day. Strengthening exercises These exercises build strength and endurance in your back. Endurance is the ability to use your muscles for a long time, even after they get tired. Pelvic tilt This exercise strengthens the muscles that lie deep in the abdomen. Lie on your back on a firm bed or the floor with your legs extended. Bend your knees so they are pointing toward the ceiling and your feet are flat on the floor. Tighten your lower abdominal muscles to press your lower back against the floor. This motion will tilt your pelvis so your tailbone points up toward the ceiling instead of pointing to your feet or the floor. To help with this exercise, you may place a small towel under your lower back and try to push your back into the towel. Hold this position for __________ seconds. Let your muscles relax completely before you repeat this exercise. Repeat __________ times. Complete this exercise __________ times a day. Alternating arm and leg raises  Get on your hands   and knees on a firm surface. If you are on a hard floor, you may want to use padding, such as an exercise mat, to cushion your knees. Line up your arms and legs. Your hands should be directly below your shoulders, and your knees should be directly below your hips. Lift your left leg behind you. At the same time, raise your right arm and straighten it in front of you. Do not lift your leg higher than your hip. Do not lift your arm higher  than your shoulder. Keep your abdominal and back muscles tight. Keep your hips facing the ground. Do not arch your back. Keep your balance carefully, and do not hold your breath. Hold this position for __________ seconds. Slowly return to the starting position. Repeat with your right leg and your left arm. Repeat __________ times. Complete this exercise __________ times a day. Abdominal set with straight leg raise  Lie on your back on a firm bed or the floor. Bend one of your knees and keep your other leg straight. Tense your abdominal muscles and lift your straight leg up, 4-6 inches (10-15 cm) off the ground. Keep your abdominal muscles tight and hold this position for __________ seconds. Do not hold your breath. Do not arch your back. Keep it flat against the ground. Keep your abdominal muscles tense as you slowly lower your leg back to the starting position. Repeat with your other leg. Repeat __________ times. Complete this exercise __________ times a day. Single leg lower with bent knees Lie on your back on a firm bed or the floor. Tense your abdominal muscles and lift your feet off the floor, one foot at a time, so your knees and hips are bent in 90-degree angles (right angles). Your knees should be over your hips and your lower legs should be parallel to the floor. Keeping your abdominal muscles tense and your knee bent, slowly lower one of your legs so your toe touches the ground. Lift your leg back up to return to the starting position. Do not hold your breath. Do not let your back arch. Keep your back flat against the ground. Repeat with your other leg. Repeat __________ times. Complete this exercise __________ times a day. Posture and body mechanics Good posture and healthy body mechanics can help to relieve stress in your body's tissues and joints. Body mechanics refers to the movements and positions of your body while you do your daily activities. Posture is part of body  mechanics. Good posture means: Your spine is in its natural S-curve position (neutral). Your shoulders are pulled back slightly. Your head is not tipped forward (neutral). Follow these guidelines to improve your posture and body mechanics in your everyday activities. Standing  When standing, keep your spine neutral and your feet about hip-width apart. Keep a slight bend in your knees. Your ears, shoulders, and hips should line up. When you do a task in which you stand in one place for a long time, place one foot up on a stable object that is 2-4 inches (5-10 cm) high, such as a footstool. This helps keep your spine neutral. Sitting  When sitting, keep your spine neutral and keep your feet flat on the floor. Use a footrest, if necessary, and keep your thighs parallel to the floor. Avoid rounding your shoulders, and avoid tilting your head forward. When working at a desk or a computer, keep your desk at a height where your hands are slightly lower than your elbows. Slide your   chair under your desk so you are close enough to maintain good posture. When working at a computer, place your monitor at a height where you are looking straight ahead and you do not have to tilt your head forward or downward to look at the screen. Resting When lying down and resting, avoid positions that are most painful for you. If you have pain with activities such as sitting, bending, stooping, or squatting, lie in a position in which your body does not bend very much. For example, avoid curling up on your side with your arms and knees near your chest (fetal position). If you have pain with activities such as standing for a long time or reaching with your arms, lie with your spine in a neutral position and bend your knees slightly. Try the following positions: Lying on your side with a pillow between your knees. Lying on your back with a pillow under your knees. Lifting  When lifting objects, keep your feet at least  shoulder-width apart and tighten your abdominal muscles. Bend your knees and hips and keep your spine neutral. It is important to lift using the strength of your legs, not your back. Do not lock your knees straight out. Always ask for help to lift heavy or awkward objects. This information is not intended to replace advice given to you by your health care provider. Make sure you discuss any questions you have with your health care provider. Document Revised: 07/22/2020 Document Reviewed: 07/22/2020 Elsevier Patient Education  2023 [employer]  

## 2022-02-16 NOTE — Progress Notes (Signed)
   Subjective:    Patient ID: Ronald Shelton, male    DOB: Apr 03, 1966, 56 y.o.   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  No numbness, tingling or weakness.  Over the last several days the pain has gotten worse, right greater than left.  He also complains of waking up on occasion with a tingling sensation in the right hand median nerve distribution.  None going on at the present time.   Review of Systems     Objective:   Physical Exam Some splinting noted when he went from sitting to standing.  Some discomfort with forward flexion as well as rotational motion.  Negative straight leg raising.  DTRs are normal.  Hand not evaluated.       Assessment & Plan:  Back strain, initial encounter - Plan: carisoprodol (SOMA) 350 MG tablet  Need for influenza vaccination - Plan: Flu Vaccine QUAD 23mo+IM (Fluarix, Fluzone & Alfiuria Quad PF)  Carpal tunnel syndrome of right wrist Discussed treating the with with heat, stretching, Tylenol and NSAID of choice and using the Soma mainly at night.  He was comfortable with that. I then discussed CTS with him and explained that keeping his wrist in a neutral position would probably be all it was necessary.  He was comfortable with that.

## 2022-02-24 ENCOUNTER — Encounter: Payer: Self-pay | Admitting: Internal Medicine

## 2022-03-09 ENCOUNTER — Encounter: Payer: Self-pay | Admitting: Internal Medicine

## 2022-04-17 ENCOUNTER — Encounter: Payer: 59 | Admitting: Family Medicine

## 2022-04-20 ENCOUNTER — Encounter: Payer: 59 | Admitting: Family Medicine

## 2022-07-23 ENCOUNTER — Encounter: Payer: Self-pay | Admitting: Family Medicine

## 2022-07-23 ENCOUNTER — Ambulatory Visit: Payer: 59 | Admitting: Family Medicine

## 2022-07-23 VITALS — BP 112/68 | HR 64 | Temp 98.0°F | Resp 16 | Ht 68.5 in | Wt 196.0 lb

## 2022-07-23 DIAGNOSIS — Z0000 Encounter for general adult medical examination without abnormal findings: Secondary | ICD-10-CM | POA: Diagnosis not present

## 2022-07-23 DIAGNOSIS — Z1211 Encounter for screening for malignant neoplasm of colon: Secondary | ICD-10-CM

## 2022-07-23 DIAGNOSIS — Z23 Encounter for immunization: Secondary | ICD-10-CM | POA: Diagnosis not present

## 2022-07-23 LAB — POCT URINALYSIS DIP (PROADVANTAGE DEVICE)
Bilirubin, UA: NEGATIVE
Blood, UA: NEGATIVE
Glucose, UA: NEGATIVE mg/dL
Ketones, POC UA: NEGATIVE mg/dL
Leukocytes, UA: NEGATIVE
Nitrite, UA: NEGATIVE
Protein Ur, POC: NEGATIVE mg/dL
Specific Gravity, Urine: 1.01
Urobilinogen, Ur: 0.2
pH, UA: 6 (ref 5.0–8.0)

## 2022-07-23 LAB — LIPID PANEL

## 2022-07-23 NOTE — Progress Notes (Addendum)
Complete physical exam  Patient: Ronald Shelton   DOB: [birth year]   56 y.o. Male  MRN: [id number]  Subjective:    Chief Complaint  Patient presents with   Annual Exam    Fasting.     Ronald Shelton is a 57 y.o. male who presents today for a complete physical exam. He reports consuming a general diet. Home exercise routine includes walking. He generally feels well. He reports sleeping fairly well. He does have additional problems to discuss today (left shoulder pain and numbness in fingers).  He has been seen by orthopedics and the thought this was a bicipital tendon injury.  He is still having some difficulty and is asking my thoughts on this.  He also complains of a tingling sensation in his right hand down into the median nerve distribution.  It tends to come and go.   Most recent fall risk assessment:    07/23/2022    2:04 PM  [place] in the past year? 0  Number falls in past yr: 0  Injury with Fall? 0  Risk for fall due to : No Fall Risks  Follow up Falls evaluation completed     Most recent depression screenings:    07/23/2022    2:04 PM 04/15/2021    2:09 PM  PHQ 2/9 Scores  PHQ - 2 Score 0 0    Vision:Within last year and Dental: Receives regular dental care     Patient Care Team: Denita Lung, MD as PCP - General   Outpatient Medications Prior to Visit  Medication Sig Note   Azelaic Acid 15 % cream Apply 1 application topically 2 (two) times daily.    clindamycin (CLEOCIN T) 1 % external solution     naproxen sodium (ALEVE) 220 MG tablet Take 440 mg by mouth. 07/23/2022: prn   carisoprodol (SOMA) 350 MG tablet Take 1 tablet (350 mg total) by mouth at bedtime. (Patient not taking: Reported on 07/23/2022)    [DISCONTINUED] ibuprofen (ADVIL) 200 MG tablet Take 200 mg by mouth every 6 (six) hours as needed. (Patient not taking: Reported on 02/16/2022)    [DISCONTINUED] methocarbamol (ROBAXIN) 500 MG tablet Take 1-2 tablets (500-1,000 mg total) by mouth  every 8 (eight) hours as needed for muscle spasms. (Patient not taking: Reported on 04/15/2021)    [DISCONTINUED] methylPREDNISolone (MEDROL DOSEPAK) 4 MG TBPK tablet Take as directed (Patient not taking: Reported on 04/15/2021)    No facility-administered medications prior to visit.    Review of Systems  All other systems reviewed and are negative.         Objective:     BP 112/68   Pulse 64   Temp 98 F (36.7 C) (Oral)   Resp 16   Ht 5' 8.5" (1.74 m)   Wt 196 lb (88.9 kg)   SpO2 99% Comment: room air  BMI 29.37 kg/m     Physical Exam  Alert and in no distress. Tympanic membranes and canals are normal. Pharyngeal area is normal. Neck is supple without adenopathy or thyromegaly. Cardiac exam shows a regular sinus rhythm without murmurs or gallops. Lungs are clear to auscultation. EKG read by me shows a rate of 67 with other parameters being normal.      Assessment & Plan:    Routine general medical examination at a health care facility - Plan: CBC with Differential/Platelet, Comprehensive metabolic panel, Lipid panel, EKG 12-Lead, POCT Urinalysis DIP (Proadvantage Device)  Screening for colon  cancer - Plan: Cologuard  Need for COVID-19 vaccine - Plan: [place] Fall 2023 Covid-19 Vaccine 19yr and older I discussed the shoulder issue with him and think further evaluation with ultrasound and or physical therapy would certainly be reasonable for this.  In terms of the hand issue since it goes away quickly, further intervention at this point is really needed.  He was comfortable with that Immunization History  Administered Date(s) Administered   COVID-19, mRNA, vaccine(Comirnaty)12 years and older 07/23/2022   DT (Pediatric) 08/17/2003   Influenza Split 06/01/2011   Influenza,inj,Quad PF,6+ Mos 04/17/2016, 05/20/2018, 01/25/2019, 04/03/2020, 04/15/2021, 02/16/2022   Moderna Covid-19 Vaccine Bivalent Booster 19yr& up 04/15/2021   Moderna SARS-COV2 Booster Vaccination  11/25/2020   Moderna Sars-Covid-2 Vaccination 07/10/2019, 08/15/2019, 04/03/2020   PPD Test 04/17/2016   Tdap 04/17/2016    Health Maintenance  Topic Date Due   HIV Screening  Never done   Zoster Vaccines- Shingrix (1 of 2) Never done   Fecal DNA (Cologuard)  Never done   COVID-19 Vaccine (6 - 2023-24 season) 09/17/2022   DTaP/Tdap/Td (3 - Td or Tdap) 04/17/2026   INFLUENZA VACCINE  Completed   Hepatitis C Screening  Completed   HPV VACCINES  Aged Out    Discussed health benefits of physical activity, and encouraged him to engage in regular exercise appropriate for his age and condition.  Problem List Items Addressed This Visit   None Visit Diagnoses     Routine general medical examination at a health care facility    -  Primary   Relevant Orders   CBC with Differential/Platelet   Comprehensive metabolic panel   Lipid panel   EKG 12-Lead   POCT Urinalysis DIP (Proadvantage Device) (Completed)   Screening for colon cancer       Relevant Orders   Cologuard   Need for COVID-19 vaccine       Relevant Orders   Pfizer Fall 2023 Covid-19 Vaccine 1281yrnd older (Completed)      Follow-up 1 year   JohJill AlexandersD

## 2022-07-24 LAB — CBC WITH DIFFERENTIAL/PLATELET
Basophils Absolute: 0.1 x10E3/uL (ref 0.0–0.2)
Basos: 1 %
EOS (ABSOLUTE): 0.1 x10E3/uL (ref 0.0–0.4)
Eos: 1 %
Hematocrit: 45.3 % (ref 37.5–51.0)
Hemoglobin: 15.7 g/dL (ref 13.0–17.7)
Immature Grans (Abs): 0 x10E3/uL (ref 0.0–0.1)
Immature Granulocytes: 0 %
Lymphocytes Absolute: 3.3 x10E3/uL — ABNORMAL HIGH (ref 0.7–3.1)
Lymphs: 42 %
MCH: 30.2 pg (ref 26.6–33.0)
MCHC: 34.7 g/dL (ref 31.5–35.7)
MCV: 87 fL (ref 79–97)
Monocytes Absolute: 0.7 x10E3/uL (ref 0.1–0.9)
Monocytes: 9 %
Neutrophils Absolute: 3.6 x10E3/uL (ref 1.4–7.0)
Neutrophils: 47 %
Platelets: 275 x10E3/uL (ref 150–450)
RBC: 5.2 x10E6/uL (ref 4.14–5.80)
RDW: 12.6 % (ref 11.6–15.4)
WBC: 7.8 x10E3/uL (ref 3.4–10.8)

## 2022-07-24 LAB — COMPREHENSIVE METABOLIC PANEL WITH GFR
ALT: 24 IU/L (ref 0–44)
AST: 21 IU/L (ref 0–40)
Albumin/Globulin Ratio: 1.7 (ref 1.2–2.2)
Albumin: 4.7 g/dL (ref 3.8–4.9)
Alkaline Phosphatase: 82 IU/L (ref 44–121)
BUN/Creatinine Ratio: 21 — ABNORMAL HIGH (ref 9–20)
BUN: 21 mg/dL (ref 6–24)
Bilirubin Total: 0.5 mg/dL (ref 0.0–1.2)
CO2: 21 mmol/L (ref 20–29)
Calcium: 9.3 mg/dL (ref 8.7–10.2)
Chloride: 102 mmol/L (ref 96–106)
Creatinine, Ser: 0.98 mg/dL (ref 0.76–1.27)
Globulin, Total: 2.7 g/dL (ref 1.5–4.5)
Glucose: 93 mg/dL (ref 70–99)
Potassium: 4.3 mmol/L (ref 3.5–5.2)
Sodium: 139 mmol/L (ref 134–144)
Total Protein: 7.4 g/dL (ref 6.0–8.5)
eGFR: 89 mL/min/1.73

## 2022-07-24 LAB — LIPID PANEL
Chol/HDL Ratio: 5 ratio (ref 0.0–5.0)
Cholesterol, Total: 251 mg/dL — ABNORMAL HIGH (ref 100–199)
HDL: 50 mg/dL (ref >39)
LDL Chol Calc (NIH): 178 mg/dL — ABNORMAL HIGH (ref 0–99)
Triglycerides: 127 mg/dL (ref 0–149)
VLDL Cholesterol Cal: 23 mg/dL (ref 5–40)

## 2023-02-15 ENCOUNTER — Encounter: Payer: Self-pay | Admitting: Family Medicine

## 2023-02-19 ENCOUNTER — Other Ambulatory Visit: Payer: Self-pay | Admitting: Family Medicine

## 2023-02-19 DIAGNOSIS — Z1212 Encounter for screening for malignant neoplasm of rectum: Secondary | ICD-10-CM

## 2023-02-19 DIAGNOSIS — Z1211 Encounter for screening for malignant neoplasm of colon: Secondary | ICD-10-CM

## 2023-06-10 ENCOUNTER — Telehealth: Payer: Self-pay | Admitting: Family Medicine

## 2023-06-10 NOTE — Telephone Encounter (Signed)
Health Exam Form sent over by patient for completion  Had physical March 2024 Does need TB test, will schedule, form being sent back in folder

## 2023-06-14 ENCOUNTER — Other Ambulatory Visit: Payer: 59

## 2023-06-14 DIAGNOSIS — Z111 Encounter for screening for respiratory tuberculosis: Secondary | ICD-10-CM

## 2023-06-18 LAB — QUANTIFERON-TB GOLD PLUS
QuantiFERON Mitogen Value: >10 [IU]/mL
QuantiFERON Nil Value: 0.16 [IU]/mL
QuantiFERON TB1 Ag Value: 0.21 [IU]/mL
QuantiFERON TB2 Ag Value: 0.2 [IU]/mL
QuantiFERON-TB Gold Plus: NEGATIVE

## 2023-07-13 ENCOUNTER — Encounter: Payer: Self-pay | Admitting: Internal Medicine

## 2023-08-04 ENCOUNTER — Encounter: Payer: 59 | Admitting: Family Medicine

## 2023-08-18 ENCOUNTER — Encounter: Admitting: Family Medicine

## 2023-08-18 DIAGNOSIS — Z23 Encounter for immunization: Secondary | ICD-10-CM

## 2023-08-18 DIAGNOSIS — E782 Mixed hyperlipidemia: Secondary | ICD-10-CM

## 2023-08-18 DIAGNOSIS — Z0000 Encounter for general adult medical examination without abnormal findings: Secondary | ICD-10-CM

## 2023-09-27 ENCOUNTER — Encounter (HOSPITAL_COMMUNITY): Payer: Self-pay

## 2023-10-04 ENCOUNTER — Encounter: Admitting: Family Medicine

## 2023-11-04 ENCOUNTER — Ambulatory Visit (INDEPENDENT_AMBULATORY_CARE_PROVIDER_SITE_OTHER): Admitting: Family Medicine

## 2023-11-04 ENCOUNTER — Encounter: Payer: Self-pay | Admitting: Family Medicine

## 2023-11-04 VITALS — BP 126/80 | HR 77 | Ht 69.0 in | Wt 197.4 lb

## 2023-11-04 DIAGNOSIS — Z0000 Encounter for general adult medical examination without abnormal findings: Secondary | ICD-10-CM

## 2023-11-04 DIAGNOSIS — Z23 Encounter for immunization: Secondary | ICD-10-CM | POA: Diagnosis not present

## 2023-11-04 DIAGNOSIS — Z1211 Encounter for screening for malignant neoplasm of colon: Secondary | ICD-10-CM

## 2023-11-04 DIAGNOSIS — M9261 Juvenile osteochondrosis of tarsus, right ankle: Secondary | ICD-10-CM

## 2023-11-04 DIAGNOSIS — Z13220 Encounter for screening for lipoid disorders: Secondary | ICD-10-CM

## 2023-11-04 DIAGNOSIS — M9262 Juvenile osteochondrosis of tarsus, left ankle: Secondary | ICD-10-CM

## 2023-11-04 LAB — LIPID PANEL

## 2023-11-04 NOTE — Patient Instructions (Signed)
 Use MiraLAX on a regular basis

## 2023-11-04 NOTE — Progress Notes (Signed)
 Complete physical exam  Patient: Ronald Shelton   DOB: 1965/12/16   58 y.o. Male  MRN: [id number]  Subjective:    Chief Complaint  Patient presents with   Annual Exam    Cpe.     Ronald Shelton is a 58 y.o. male who presents today for a complete physical exam.  He reports consuming a general diet. Home gym, biking, walking.  He generally feels well. He reports sleeping well.  He does complain of difficulty with his left shoulder and describes abduction externally rotating causing some symptoms but states that at this time it is doing better.  He has had difficulty with this intermittently since about a year ago.  He also has a history of Haglund's deformity and notes that with increased physical activity this causes more trouble.  He also complains of intermittent difficulty with bowel habits going from normal to several days without a BM and then becoming slightly constipated.  He was given the Cologuard in the past but he did not send it in.  He is now teaching school again.  His home life is quite stable.  He has no other concerns other than apparently his brother mention something about lp a.  He does not have a family history of heart disease, does not smoke nor have diabetes. 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 Most recent fall risk assessment:    11/04/2023    9:17 AM  Fall Risk   Falls in the past year? 0  Number falls in past yr: 0  Injury with Fall? 0  Risk for fall due to : No Fall Risks  Follow up Falls evaluation completed     Most recent depression screenings:    11/04/2023    9:18 AM 07/23/2022    2:04 PM  PHQ 2/9 Scores  PHQ - 2 Score 0 0    Vision:Within last year and Dental: No current dental problems and Receives regular dental care    Immunization History  Administered Date(s) Administered   DT (Pediatric) 08/17/2003   Influenza Split 06/01/2011   Influenza,inj,Quad PF,6+ Mos 04/17/2016, 05/20/2018, 01/25/2019, 04/03/2020, 04/15/2021,  02/16/2022   Moderna Covid-19 Vaccine Bivalent Booster 66yrs & up 04/15/2021   Moderna SARS-COV2 Booster Vaccination 11/25/2020   Moderna Sars-Covid-2 Vaccination 07/10/2019, 08/15/2019, 04/03/2020   PNEUMOCOCCAL CONJUGATE-20 11/04/2023   PPD Test 04/17/2016   Pfizer(Comirnaty)Fall Seasonal Vaccine 12 years and older 07/23/2022   Tdap 04/17/2016    Health Maintenance  Topic Date Due   HIV Screening  Never done   Zoster Vaccines- Shingrix (1 of 2) Never done   Fecal DNA (Cologuard)  Never done   COVID-19 Vaccine (6 - 2024-25 season) 01/17/2023   INFLUENZA VACCINE  12/17/2023   DTaP/Tdap/Td (3 - Td or Tdap) 04/17/2026   Hepatitis C Screening  Completed   Pneumococcal Vaccine 43-1 Years old  Aged Out   HPV VACCINES  Aged Out   Meningococcal B Vaccine  Aged Out    Patient Care Team: Watson Hacking, MD as PCP - General   Outpatient Medications Prior to Visit  Medication Sig   Azelaic Acid 15 % cream Apply 1 application topically 2 (two) times daily.   clindamycin (CLEOCIN T) 1 % external solution    naproxen sodium (ALEVE) 220 MG tablet Take 440 mg by mouth.   carisoprodol  (SOMA ) 350 MG tablet Take 1 tablet (350 mg total) by mouth at bedtime. (Patient not taking: Reported on 07/23/2022)  No facility-administered medications prior to visit.    Review of Systems  All other systems reviewed and are negative.   Family and social history as well as health maintenance and immunizations was reviewed.     Objective:      Physical Exam  Alert and in no distress. Tympanic membranes and canals are normal. Pharyngeal area is normal. Neck is supple without adenopathy or thyromegaly. Cardiac exam shows a regular sinus rhythm without murmurs or gallops. Lungs are clear to auscultation.  Shoulder exam was essentially negative.       Assessment & Plan:     Routine general medical examination at a health care facility - Plan: CBC with Differential/Platelet, Comprehensive metabolic  panel with GFR  Need for vaccination against Streptococcus pneumoniae - Plan: Pneumococcal conjugate vaccine 20-valent (Prevnar 20)  Haglund's deformity, bilateral  Screening for colon cancer - Plan: Cologuard  Screening for lipid disorders - Plan: Lipid panel  Since he is not having a great deal of difficulty with his left shoulder, further intervention is really not necessary and he was comfortable with that.  In terms of the Haglund's deformity.  I recommend that he stay as physically active as he can and recognize that taking Tylenol or Advil for symptomatic relief is okay. Return in about 1 year (around 11/03/2024).      Ronald Cobbs, MD

## 2023-11-05 ENCOUNTER — Ambulatory Visit: Payer: Self-pay | Admitting: Family Medicine

## 2023-11-05 LAB — CBC WITH DIFFERENTIAL/PLATELET
Basophils Absolute: 0.1 10*3/uL (ref 0.0–0.2)
Basos: 1 %
EOS (ABSOLUTE): 0.1 10*3/uL (ref 0.0–0.4)
Eos: 2 %
Hematocrit: 47.3 % (ref 37.5–51.0)
Hemoglobin: 15.7 g/dL (ref 13.0–17.7)
Immature Grans (Abs): 0 10*3/uL (ref 0.0–0.1)
Immature Granulocytes: 0 %
Lymphocytes Absolute: 3 10*3/uL (ref 0.7–3.1)
Lymphs: 43 %
MCH: 30.2 pg (ref 26.6–33.0)
MCHC: 33.2 g/dL (ref 31.5–35.7)
MCV: 91 fL (ref 79–97)
Monocytes Absolute: 0.6 10*3/uL (ref 0.1–0.9)
Monocytes: 9 %
Neutrophils Absolute: 3.3 10*3/uL (ref 1.4–7.0)
Neutrophils: 45 %
Platelets: 252 10*3/uL (ref 150–450)
RBC: 5.2 x10E6/uL (ref 4.14–5.80)
RDW: 12.7 % (ref 11.6–15.4)
WBC: 7.1 10*3/uL (ref 3.4–10.8)

## 2023-11-05 LAB — LIPID PANEL
Chol/HDL Ratio: 5.4 ratio — ABNORMAL HIGH (ref 0.0–5.0)
Cholesterol, Total: 248 mg/dL — ABNORMAL HIGH (ref 100–199)
HDL: 46 mg/dL (ref >39)
LDL Chol Calc (NIH): 177 mg/dL — ABNORMAL HIGH (ref 0–99)
Triglycerides: 137 mg/dL (ref 0–149)
VLDL Cholesterol Cal: 25 mg/dL (ref 5–40)

## 2023-11-05 LAB — COMPREHENSIVE METABOLIC PANEL WITH GFR
ALT: 24 IU/L (ref 0–44)
AST: 19 IU/L (ref 0–40)
Albumin: 4.5 g/dL (ref 3.8–4.9)
Alkaline Phosphatase: 74 IU/L (ref 44–121)
BUN/Creatinine Ratio: 17 (ref 9–20)
BUN: 16 mg/dL (ref 6–24)
Bilirubin Total: 0.4 mg/dL (ref 0.0–1.2)
CO2: 19 mmol/L — ABNORMAL LOW (ref 20–29)
Calcium: 8.9 mg/dL (ref 8.7–10.2)
Chloride: 102 mmol/L (ref 96–106)
Creatinine, Ser: 0.96 mg/dL (ref 0.76–1.27)
Globulin, Total: 2.4 g/dL (ref 1.5–4.5)
Glucose: 97 mg/dL (ref 70–99)
Potassium: 4.3 mmol/L (ref 3.5–5.2)
Sodium: 138 mmol/L (ref 134–144)
Total Protein: 6.9 g/dL (ref 6.0–8.5)
eGFR: 91 mL/min/{1.73_m2} (ref >59)

## 2024-04-18 ENCOUNTER — Encounter: Payer: Self-pay | Admitting: Family Medicine

## 2024-04-18 ENCOUNTER — Ambulatory Visit: Admitting: Family Medicine

## 2024-04-18 VITALS — BP 132/70 | HR 77 | Ht 69.0 in | Wt 202.0 lb

## 2024-04-18 DIAGNOSIS — M5459 Other low back pain: Secondary | ICD-10-CM | POA: Diagnosis not present

## 2024-04-18 DIAGNOSIS — Z23 Encounter for immunization: Secondary | ICD-10-CM

## 2024-04-18 NOTE — Progress Notes (Signed)
   Subjective:    Patient ID: Oneil DELENA Rower, male    DOB: [birth year], 58 y.o.   MRN: [id number]  Discussed the use of AI scribe software for clinical note transcription with the patient, who gave verbal consent to proceed.  History of Present Illness   FERMON URETA is a 58 year old male who presents with low back pain.  He has been experiencing low back pain for the past three to four weeks, which began after moving heavy pavers in his yard. The pain initially presented as soreness and worsened with additional yard work, including mowing and mulching leaves.  The pain is described as a tightness in the lower back, particularly when bending over to tie his shoes or after sitting for a while. It takes a few seconds to straighten up after bending. The pain is primarily located at or just above the belt line and is central rather than one-sided.  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 However, he occasionally feels the pain when driving or in the shower when raising a leg.  No numbness, tingling, or weakness in the legs or buttocks. He has not experienced any radiation of pain down his legs.  He has not yet tried any specific treatments for the pain but has been avoiding activities that exacerbate the pain, such as bending over to tie his shoes.           Review of Systems     Objective:    Physical Exam Alert and in no distress.  No tenderness to palpation of his back.  Normal lumbar curve and motion.  Negative straight leg raising.  Normal DTRs.  Hip motion is normal.             Assessment & Plan:     Acute low back strain Pain is mechanical with no nerve involvement. Symptoms suggest musculoskeletal strain. - Recommended Aleve (naproxen) two tablets twice daily. - Advised heat application to lower back for 20 minutes, three times daily. - Instructed on  proper sitting posture with feet flat and back supported. - Recommended gentle stretching exercises, focusing on one leg at a time. - Advised on proper lifting techniques, emphasizing squatting and bending with knees. - Consider referral to physical therapy if no improvement.

## 2024-04-21 ENCOUNTER — Encounter: Payer: Self-pay | Admitting: Family Medicine

## 2024-11-06 ENCOUNTER — Encounter: Payer: Self-pay | Admitting: Family Medicine
